# Patient Record
Sex: Male | Born: 1955 | Race: White | Hispanic: No | Marital: Married | Smoking: Never smoker
Health system: Southern US, Community
[De-identification: ages and names within clinical notes are randomized; demographics above are authoritative.]

## PROBLEM LIST (undated history)

## (undated) DIAGNOSIS — I10 Essential (primary) hypertension: Secondary | ICD-10-CM

## (undated) DIAGNOSIS — T7840XA Allergy, unspecified, initial encounter: Secondary | ICD-10-CM

## (undated) DIAGNOSIS — Z87442 Personal history of urinary calculi: Secondary | ICD-10-CM

## (undated) DIAGNOSIS — N189 Chronic kidney disease, unspecified: Secondary | ICD-10-CM

## (undated) DIAGNOSIS — E7800 Pure hypercholesterolemia, unspecified: Secondary | ICD-10-CM

## (undated) HISTORY — PX: ANKLE SURGERY: SHX546

## (undated) HISTORY — DX: Chronic kidney disease, unspecified: N18.9

## (undated) HISTORY — DX: Essential (primary) hypertension: I10

## (undated) HISTORY — PX: EYE SURGERY: SHX253

## (undated) HISTORY — DX: Allergy, unspecified, initial encounter: T78.40XA

## (undated) HISTORY — PX: COLONOSCOPY: SHX174

---

## 2010-04-07 ENCOUNTER — Encounter: Payer: Self-pay | Admitting: Internal Medicine

## 2010-08-18 NOTE — Letter (Signed)
Summary: Colonoscopy Date Change Letter  [facility]  [address]   [place], [place] [address]   Phone: [phone]  Fax: [phone]      April 07, 2010 MRN: [id number]   Joshua [name] [address] [place], [place]  [address]   Dear Mr. Carley,   Previously you were recommended to have a repeat colonoscopy around this time. Your chart was recently reviewed by Dr.Gessner of [facility]. Follow up colonoscopy is now recommended in February 2015. This revised recommendation is based on current, nationally recognized guidelines for colorectal cancer screening and polyp surveillance. These guidelines are endorsed by the American Cancer Society, [employer] on Colorectal Cancer as well as numerous other major medical organizations.  Please understand that our recommendation assumes that you do not have any new symptoms such as bleeding, a change in bowel habits, anemia, or significant abdominal discomfort. If you do have any concerning GI symptoms or want to discuss the guideline recommendations, please call to arrange an office visit at your earliest convenience. Otherwise we will keep you in our reminder system and contact you 1-2 months prior to the date listed above to schedule your next colonoscopy.  Thank you,   Stan Head, M.D.  [facility] [phone]

## 2011-07-14 ENCOUNTER — Encounter: Payer: Self-pay | Admitting: Emergency Medicine

## 2011-08-03 ENCOUNTER — Other Ambulatory Visit: Payer: Self-pay | Admitting: Emergency Medicine

## 2011-08-04 ENCOUNTER — Ambulatory Visit (INDEPENDENT_AMBULATORY_CARE_PROVIDER_SITE_OTHER): Payer: 59 | Admitting: Emergency Medicine

## 2011-08-04 ENCOUNTER — Encounter: Payer: Self-pay | Admitting: Emergency Medicine

## 2011-08-04 VITALS — BP 127/85 | HR 74 | Temp 98.5°F | Resp 16 | Ht 72.0 in | Wt 236.0 lb

## 2011-08-04 DIAGNOSIS — B078 Other viral warts: Secondary | ICD-10-CM

## 2011-08-04 DIAGNOSIS — Z0000 Encounter for general adult medical examination without abnormal findings: Secondary | ICD-10-CM

## 2011-08-04 DIAGNOSIS — I10 Essential (primary) hypertension: Secondary | ICD-10-CM

## 2011-08-04 DIAGNOSIS — E663 Overweight: Secondary | ICD-10-CM

## 2011-08-04 LAB — POCT URINALYSIS DIPSTICK
Glucose, UA: NEGATIVE
Leukocytes, UA: NEGATIVE
Nitrite, UA: NEGATIVE
Protein, UA: NEGATIVE
Urobilinogen, UA: 0.2

## 2011-08-04 LAB — COMPREHENSIVE METABOLIC PANEL
AST: 18 U/L (ref 0–37)
Albumin: 4.3 g/dL (ref 3.5–5.2)
BUN: 14 mg/dL (ref 6–23)
CO2: 23 mEq/L (ref 19–32)
Calcium: 9.2 mg/dL (ref 8.4–10.5)
Chloride: 108 mEq/L (ref 96–112)
Potassium: 4.2 mEq/L (ref 3.5–5.3)

## 2011-08-04 LAB — POCT UA - MICROSCOPIC ONLY
Epithelial cells, urine per micros: NEGATIVE
RBC, urine, microscopic: NEGATIVE
WBC, Ur, HPF, POC: NEGATIVE
Yeast, UA: NEGATIVE

## 2011-08-04 LAB — LIPID PANEL
Cholesterol: 133 mg/dL (ref 0–200)
HDL: 38 mg/dL — ABNORMAL LOW (ref >39)

## 2011-08-04 LAB — IFOBT (OCCULT BLOOD): IFOBT: NEGATIVE

## 2011-08-04 LAB — TSH: TSH: 2.035 u[IU]/mL (ref 0.350–4.500)

## 2011-08-04 MED ORDER — LISINOPRIL 40 MG PO TABS: 40.0000 mg | ORAL_TABLET | Freq: Every day | ORAL | Status: DC

## 2011-08-04 MED ORDER — AMLODIPINE BESYLATE 5 MG PO TABS: 5.0000 mg | ORAL_TABLET | Freq: Every day | ORAL | Status: DC

## 2011-08-04 NOTE — Progress Notes (Signed)
  Subjective:    Patient ID: Joshua [name], male    DOB: 01-31-1956, 56 y.o.   MRN: [id number]  HPI patient here for his annual physical exam he is scheduled to be married in approximately 5 weeks. This will be his first marriage his fianc second marriage. She has 2 children in their 83s.    Review of Systems  Constitutional: Negative.   HENT: Negative.   Eyes: Negative.   Respiratory: Negative.   Cardiovascular: Negative.   Gastrointestinal: Negative.   Genitourinary: Negative.   Musculoskeletal: Negative.   Skin:       He has a plantars wart on his right foot he would like to have treated  Neurological: Negative.   Hematological: Negative.   Psychiatric/Behavioral: Negative.        Objective:   Physical Exam  Constitutional: He appears well-developed and well-nourished.  HENT:  Head: Normocephalic.  Right Ear: External ear normal.  Left Ear: External ear normal.  Eyes: Pupils are equal, round, and reactive to light.  Neck: No tracheal deviation present. No thyromegaly present.  Cardiovascular: Normal rate and regular rhythm.  Exam reveals friction rub. Exam reveals no gallop.   No murmur heard. Pulmonary/Chest: Effort normal and breath sounds normal. No respiratory distress. He has no wheezes. He has no rales. He exhibits no tenderness.  Abdominal: Soft. Bowel sounds are normal. He exhibits no distension and no mass. There is no tenderness. There is no rebound and no guarding.  Musculoskeletal: Normal range of motion.  Neurological: He is alert.  Skin:       There is a 1 x 1 cm plantars wart over the mid metatarsals right foot   Results for orders placed in visit on 08/04/11  POCT URINALYSIS DIPSTICK      Component Value Range   Color, UA yellow     Clarity, UA hazy     Glucose, UA neg     Bilirubin, UA neg     Ketones, UA trace     Spec Grav, UA 1.025     Blood, UA neg     pH, UA 5.5     Protein, UA neg     Urobilinogen, UA 0.2     Nitrite, UA neg     Leukocytes, UA Negative    POCT UA - MICROSCOPIC ONLY      Component Value Range   WBC, Ur, HPF, POC neg     RBC, urine, microscopic neg     Bacteria, U Microscopic neg     Mucus, UA neg     Epithelial cells, urine per micros neg     Crystals, Ur, HPF, POC trace     Casts, Ur, LPF, POC neg     Yeast, UA neg    IFOBT (OCCULT BLOOD)      Component Value Range   IFOBT Negative           Assessment & Plan:  He looks great he is doing well on the current treatment program. He is to continue to work on exercise and weight loss. He was given a prescription for shingles vaccine.

## 2011-08-04 NOTE — Progress Notes (Signed)
  Subjective:    Patient ID: Joshua [name], male    DOB: 08-28-1955, 56 y.o.   MRN: [id number]  HPI    Review of Systems  Constitutional: Negative.   HENT: Positive for tinnitus.   Eyes: Negative.   Respiratory: Negative.   Cardiovascular: Negative.   Gastrointestinal: Negative.   Genitourinary: Negative.   Skin: Negative.   Neurological: Negative.   Hematological: Negative.   Psychiatric/Behavioral: Negative.        Objective:   Physical Exam        Assessment & Plan:

## 2011-08-05 LAB — PSA: PSA: 0.99 ng/mL (ref <=4.00)

## 2011-08-05 LAB — CBC
HCT: 45.3 % (ref 39.0–52.0)
Hemoglobin: 14.8 g/dL (ref 13.0–17.0)
RBC: 5.03 MIL/uL (ref 4.22–5.81)
RDW: 13.6 % (ref 11.5–15.5)
WBC: 5.9 10*3/uL (ref 4.0–10.5)

## 2011-09-24 ENCOUNTER — Other Ambulatory Visit: Payer: Self-pay | Admitting: Physician Assistant

## 2011-09-30 ENCOUNTER — Telehealth: Payer: Self-pay

## 2011-09-30 MED ORDER — ATORVASTATIN CALCIUM 40 MG PO TABS: 40.0000 mg | ORAL_TABLET | Freq: Every day | ORAL | Status: DC

## 2011-09-30 NOTE — Telephone Encounter (Signed)
Called pt and lipitor is 40 mg this is sent in for him and he is aware.

## 2011-09-30 NOTE — Telephone Encounter (Signed)
The patient called to state that the rx's from 08/04/11 were never sent to his pharmacy ([facility] in [place]-phone: [phone])  The patient is requesting 90 day supply of Amlodipine, Lisinopril and generic Lipitor.  Please call the patient at [phone].

## 2011-09-30 NOTE — Telephone Encounter (Signed)
Both meds sent and received by the pharmacy on 08/04/2011.  Since he'd just had them filled on 6/10, they were placed on hold.  I asked Melissa to go ahead and fill them for him, but as #90, instead of #30.  The Lipitor was not initially sent, but it is not on his medication list in [facility].  Please get the dose and send in #90, RF x 3.

## 2011-11-16 ENCOUNTER — Ambulatory Visit: Payer: 59

## 2011-11-16 ENCOUNTER — Ambulatory Visit: Payer: 59 | Admitting: Family Medicine

## 2011-11-16 VITALS — BP 130/86 | HR 84 | Temp 97.6°F | Resp 18 | Ht 73.0 in | Wt 236.0 lb

## 2011-11-16 DIAGNOSIS — R05 Cough: Secondary | ICD-10-CM

## 2011-11-16 DIAGNOSIS — R059 Cough, unspecified: Secondary | ICD-10-CM

## 2011-11-16 DIAGNOSIS — R350 Frequency of micturition: Secondary | ICD-10-CM

## 2011-11-16 DIAGNOSIS — J189 Pneumonia, unspecified organism: Secondary | ICD-10-CM

## 2011-11-16 LAB — POCT URINALYSIS DIPSTICK
Bilirubin, UA: NEGATIVE
Glucose, UA: NEGATIVE
Ketones, UA: NEGATIVE
Leukocytes, UA: NEGATIVE
Nitrite, UA: NEGATIVE

## 2011-11-16 LAB — POCT UA - MICROSCOPIC ONLY
Epithelial cells, urine per micros: NEGATIVE
Yeast, UA: NEGATIVE

## 2011-11-16 MED ORDER — HYDROCODONE-HOMATROPINE 5-1.5 MG/5ML PO SYRP: 5.0000 mL | ORAL_SOLUTION | Freq: Three times a day (TID) | ORAL | Status: DC | PRN

## 2011-11-16 MED ORDER — MOXIFLOXACIN HCL 400 MG PO TABS: 400.0000 mg | ORAL_TABLET | Freq: Every day | ORAL | Status: DC

## 2011-11-16 NOTE — Progress Notes (Signed)
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  He's actually had a cough since the end of July.  The cough has not been productive.  Has been on the lisinopril for a couple years.  Also developed polyuria on the flight back from [place].  Objective:  NAD, color good HEENT:  Some rhinorrhea and PND, otherwise neg Neck:  Supple, no adenopathy Chest: few ronchi, no rales Heart:  Regular without murmur, gallop or rub  Results for orders placed in visit on 11/16/11  POCT UA - MICROSCOPIC ONLY      Component Value Range   WBC, Ur, HPF, POC 0-2     RBC, urine, microscopic 0-2     Bacteria, U Microscopic neg     Mucus, UA trace     Epithelial cells, urine per micros neg     Crystals, Ur, HPF, POC neg     Casts, Ur, LPF, POC neg     Yeast, UA neg    POCT URINALYSIS DIPSTICK      Component Value Range   Color, UA yellow     Clarity, UA clear     Glucose, UA neg     Bilirubin, UA neg     Ketones, UA neg     Spec Grav, UA 1.020     Blood, UA neg     pH, UA 6.0     Protein, UA neg     Urobilinogen, UA 0.2     Nitrite, UA neg     Leukocytes, UA Negative     [facility] reading (PRIMARY) by  Dr. Milus Glazier:  CXR - infiltrate RLL  Assessment:  Persistent cough with infiltrate.  Polyuria believed to be a transient symptoms  1. Pneumonia  moxifloxacin (AVELOX) 400 MG tablet  2. Urinary frequency  POCT UA - Microscopic Only, POCT urinalysis dipstick  3. Cough  DG Chest 2 View, HYDROcodone-homatropine (HYCODAN) 5-1.5 MG/5ML syrup   .

## 2011-11-29 ENCOUNTER — Ambulatory Visit: Payer: 59

## 2011-11-29 ENCOUNTER — Ambulatory Visit (INDEPENDENT_AMBULATORY_CARE_PROVIDER_SITE_OTHER): Payer: 59 | Admitting: Emergency Medicine

## 2011-11-29 VITALS — BP 116/80 | HR 74 | Temp 98.2°F | Resp 16 | Ht 73.0 in | Wt 240.0 lb

## 2011-11-29 DIAGNOSIS — R059 Cough, unspecified: Secondary | ICD-10-CM

## 2011-11-29 DIAGNOSIS — R05 Cough: Secondary | ICD-10-CM

## 2011-11-29 DIAGNOSIS — J129 Viral pneumonia, unspecified: Secondary | ICD-10-CM

## 2011-11-29 DIAGNOSIS — I10 Essential (primary) hypertension: Secondary | ICD-10-CM

## 2011-11-29 MED ORDER — LOSARTAN POTASSIUM 100 MG PO TABS: 100.0000 mg | ORAL_TABLET | Freq: Every day | ORAL | Status: DC

## 2011-11-29 MED ORDER — BENZONATATE 100 MG PO CAPS: 100.0000 mg | ORAL_CAPSULE | Freq: Three times a day (TID) | ORAL | Status: DC | PRN

## 2011-11-29 NOTE — Progress Notes (Signed)
  Subjective:    Patient ID: Joshua [name], male    DOB: 1955-12-26, 56 y.o.   MRN: [id number]  HPI patient is with persistent cough or he was initially treated for pneumonia with Avelox and Hycodan for cough. He feels better he history of breath easily and does cough easily with exertion he has been him to sleep okay    Review of Systems     Objective:   Physical Exam neck is supple. Chest was clear to auscultation and percussion cardiac exam reveals regular rate and rhythm without murmurs rubs or gallops. Initial chest x-ray was inconclusive with questionable epicardial fat versus infiltrate.  [facility] reading (PRIMARY) by  Dr.Genavive Kubicki there is a probable epicardial fat-pad on the right and questionable left basilar infiltrate        Assessment & Plan:  We'll check chest x-ray to make sure everything has cleared there still abnormal findings in the base of both lungs. On the right looks more like an epicardial fat pad I'm not sure if the area on the left is a resolving infiltrate. Await the radiologist's report and need a repeat chest x-ray in 2-4 weeks or plan CPE. Had stopped the patient's lisinopril and he will take losartan 100 qd.

## 2012-04-28 ENCOUNTER — Ambulatory Visit: Payer: 59 | Admitting: Family Medicine

## 2012-04-28 VITALS — BP 118/90 | HR 84 | Temp 98.3°F | Resp 16 | Ht 72.5 in | Wt 242.0 lb

## 2012-04-28 DIAGNOSIS — J019 Acute sinusitis, unspecified: Secondary | ICD-10-CM

## 2012-04-28 DIAGNOSIS — J069 Acute upper respiratory infection, unspecified: Secondary | ICD-10-CM

## 2012-04-28 MED ORDER — FLUTICASONE PROPIONATE 50 MCG/ACT NA SUSP: NASAL | Status: DC

## 2012-04-28 MED ORDER — AZITHROMYCIN 250 MG PO TABS: ORAL_TABLET | ORAL | Status: DC

## 2012-04-28 NOTE — Patient Instructions (Signed)
Take azithromycin as directed  Mucinex plain to help keep secretions thin  Flonase 2 sprays each nostril twice daily for 3 days, then once daily

## 2012-04-28 NOTE — Progress Notes (Signed)
Subjective:  57 year old man with history of having upper Sartor congestion for about 4 days. He has not been febrile. He feels full in his sinuses and congestion in his nose. It may difficult for her sleep last night. He has had some postnasal drainage and sore throat. Only a minimal cough. He is a nonsmoker. He works for [employer]. He is on blood pressure and cholesterol medications.  Objective: Well-developed well-nourished man in no major distress. TMs are normal. Nose congested. Not erythematous. Some postnasal drainage. Neck supple without significant nodes. Chest is clear to auscultation. Heart regular without murmurs.  Assessment:  URI and postnasal drainage  Plan: Mucinex.  Fluids, rest.

## 2012-08-16 ENCOUNTER — Ambulatory Visit (INDEPENDENT_AMBULATORY_CARE_PROVIDER_SITE_OTHER): Payer: 59 | Admitting: Emergency Medicine

## 2012-08-16 ENCOUNTER — Encounter: Payer: Self-pay | Admitting: Emergency Medicine

## 2012-08-16 VITALS — BP 123/89 | HR 72 | Temp 98.1°F | Resp 20 | Ht 72.0 in | Wt 242.2 lb

## 2012-08-16 DIAGNOSIS — J019 Acute sinusitis, unspecified: Secondary | ICD-10-CM

## 2012-08-16 DIAGNOSIS — I10 Essential (primary) hypertension: Secondary | ICD-10-CM

## 2012-08-16 DIAGNOSIS — Z1211 Encounter for screening for malignant neoplasm of colon: Secondary | ICD-10-CM

## 2012-08-16 DIAGNOSIS — J069 Acute upper respiratory infection, unspecified: Secondary | ICD-10-CM

## 2012-08-16 DIAGNOSIS — F329 Major depressive disorder, single episode, unspecified: Secondary | ICD-10-CM

## 2012-08-16 DIAGNOSIS — N529 Male erectile dysfunction, unspecified: Secondary | ICD-10-CM

## 2012-08-16 DIAGNOSIS — Z0000 Encounter for general adult medical examination without abnormal findings: Secondary | ICD-10-CM

## 2012-08-16 DIAGNOSIS — F3289 Other specified depressive episodes: Secondary | ICD-10-CM

## 2012-08-16 DIAGNOSIS — F32A Depression, unspecified: Secondary | ICD-10-CM

## 2012-08-16 LAB — CBC WITH DIFFERENTIAL/PLATELET
Basophils Relative: 0 % (ref 0–1)
Eosinophils Absolute: 0.1 10*3/uL (ref 0.0–0.7)
Eosinophils Relative: 2 % (ref 0–5)
Hemoglobin: 13.3 g/dL (ref 13.0–17.0)
Lymphs Abs: 1.5 10*3/uL (ref 0.7–4.0)
MCH: 33.6 pg (ref 26.0–34.0)
MCHC: 36.9 g/dL — ABNORMAL HIGH (ref 30.0–36.0)
MCV: 90.9 fL (ref 78.0–100.0)
Monocytes Relative: 7 % (ref 3–12)
Neutrophils Relative %: 61 % (ref 43–77)
Platelets: 262 10*3/uL (ref 150–400)
RBC: 3.96 MIL/uL — ABNORMAL LOW (ref 4.22–5.81)

## 2012-08-16 LAB — COMPREHENSIVE METABOLIC PANEL
Albumin: 4.2 g/dL (ref 3.5–5.2)
BUN: 17 mg/dL (ref 6–23)
CO2: 25 mEq/L (ref 19–32)
Calcium: 9.1 mg/dL (ref 8.4–10.5)
Chloride: 105 mEq/L (ref 96–112)
Creat: 0.95 mg/dL (ref 0.50–1.35)
Glucose, Bld: 99 mg/dL (ref 70–99)
Potassium: 4.2 mEq/L (ref 3.5–5.3)

## 2012-08-16 LAB — LIPID PANEL
Cholesterol: 125 mg/dL (ref 0–200)
HDL: 47 mg/dL (ref >39)
Total CHOL/HDL Ratio: 2.7 Ratio
Triglycerides: 72 mg/dL (ref <150)

## 2012-08-16 LAB — POCT URINALYSIS DIPSTICK
Bilirubin, UA: NEGATIVE
Blood, UA: NEGATIVE
Glucose, UA: NEGATIVE
Spec Grav, UA: 1.02
pH, UA: 7

## 2012-08-16 MED ORDER — ATORVASTATIN CALCIUM 40 MG PO TABS: 40.0000 mg | ORAL_TABLET | Freq: Every day | ORAL | Status: DC

## 2012-08-16 MED ORDER — AMLODIPINE BESYLATE 5 MG PO TABS: 5.0000 mg | ORAL_TABLET | Freq: Every day | ORAL | Status: DC

## 2012-08-16 MED ORDER — LOSARTAN POTASSIUM 100 MG PO TABS: 100.0000 mg | ORAL_TABLET | Freq: Every day | ORAL | Status: DC

## 2012-08-16 MED ORDER — TADALAFIL 20 MG PO TABS: ORAL_TABLET | ORAL | Status: DC

## 2012-08-16 NOTE — Progress Notes (Signed)
@[facility] @  Patient ID: Joshua [name] MRN: [id number], DOB: February 24, 1955 57 y.o. Date of Encounter: 08/16/2012, 1:18 PM  Primary Physician: Default, Provider, MD  Chief Complaint: Physical (CPE)  HPI: 57 y.o. y/o male with history noted below here for CPE.  Doing well. No issues/complaints.  Review of Systems:  Consitutional: No fever, chills, fatigue, night sweats, lymphadenopathy, or weight changes. Eyes: No visual changes, eye redness, or discharge. ENT/Mouth: Ears: No otalgia, hearing loss, discharge. Nose: No congestion, rhinorrhea, sinus pain, or epistaxis. Throat: No sore throat, post nasal drip, or teeth pain. Pt c/o  tinnitus, Cardiovascular: No CP, palpitations, diaphoresis, DOE, edema, orthopnea, PND. Respiratory: No cough, hemoptysis, SOB, or wheezing. Gastrointestinal: No anorexia, dysphagia, reflux, pain, nausea, vomiting, hematemesis, diarrhea, constipation, BRBPR, or melena. Genitourinary: No dysuria, urgency, hematuria, incontinence, nocturia, decreased urinary stream, discharge,  , or testicular pain/masses. He has had recent problems with EEP inability to sustain an erection. Pt has urinary frequency. Musculoskeletal: No decreased ROM, stiffness, joint swelling, or weakness. Joint pain Skin: No rash, erythema, lesion changes, pain, warmth, jaundice, or pruritis. Neurological: No headache, dizziness, syncope, seizures, tremors, memory loss, coordination problems, or paresthesias. Psychological: He has been somewhat depressed recently. His mother has been angry with him since he got married. She has had frequent admissions to the hospital with anxiety depression and is bothersome to him   Endocrine: No fatigue, polydipsia, polyphagia, polyuria, or known diabetes. All other systems were reviewed and are otherwise negative.  Past Medical History  Diagnosis Date  . Allergy      Past Surgical History  Procedure Laterality Date  . Eye surgery    . Right ankle     no other information given    Home Meds:  Prior to Admission medications   Medication Sig Start Date End Date Taking? Authorizing Provider  amLODipine (NORVASC) 5 MG tablet Take 1 tablet (5 mg total) by mouth daily. 08/04/11  Yes Collene Gobble, MD  aspirin 81 MG tablet Take 81 mg by mouth daily.   Yes Historical Provider, MD  atorvastatin (LIPITOR) 40 MG tablet Take 1 tablet (40 mg total) by mouth daily. 09/30/11 09/29/12 Yes Chelle S Jeffery, PA-C  losartan (COZAAR) 100 MG tablet Take 1 tablet (100 mg total) by mouth daily. 11/29/11  Yes Collene Gobble, MD  azithromycin (ZITHROMAX) 250 MG tablet Take 2 today, then one daily for infection 04/28/12   Peyton Najjar, MD  benzonatate (TESSALON) 100 MG capsule Take 1-2 capsules (100-200 mg total) by mouth 3 (three) times daily as needed for cough. 11/29/11   Collene Gobble, MD  fluticasone (FLONASE) 50 MCG/ACT nasal spray 2 sprays each nostril twice daily for 3 days, then decrease to once daily 04/28/12   Peyton Najjar, MD  HYDROcodone-homatropine [facility]) 5-1.5 MG/5ML syrup Take 5 mLs by mouth every 8 (eight) hours as needed for cough. 11/16/11   Elvina Sidle, MD  moxifloxacin (AVELOX) 400 MG tablet Take 1 tablet (400 mg total) by mouth daily. 11/16/11   Elvina Sidle, MD    Allergies: No Known Allergies  History   Social History  . Marital Status: Single    Spouse Name: N/A    Number of Children: N/A  . Years of Education: N/A   Occupational History  . Not on file.   Social History Main Topics  . Smoking status: Never Smoker   . Smokeless tobacco: Not on file  . Alcohol Use: No  . Drug Use: No  . Sexually Active:  Yes   Other Topics Concern  . Not on file   Social History Narrative  . No narrative on file    Family History  Problem Relation Age of Onset  . Hypertension Mother   . Cancer Father     Physical Exam:  Blood pressure 123/89, pulse 72, temperature 98.1 F (36.7 C), temperature source Oral, resp. rate 20, height 6'  (1.829 m), weight 242 lb 3.2 oz (109.861 kg), SpO2 96.00%.  General: Well developed, well nourished, in no acute distress. HEENT: Normocephalic, atraumatic. Conjunctiva pink, sclera non-icteric. Pupils 2 mm constricting to 1 mm, round, regular, and equally reactive to light and accomodation. EOMI. Internal auditory canal clear. TMs with good cone of light and without pathology. Nasal mucosa pink. Nares are without discharge. No sinus tenderness. Oral mucosa pink. Dentition . Pharynx without exudate.   Neck: Supple. Trachea midline. No thyromegaly. Full ROM. No lymphadenopathy. Lungs: Clear to auscultation bilaterally without wheezes, rales, or rhonchi. Breathing is of normal effort and unlabored. Cardiovascular: RRR with S1 S2. No murmurs, rubs, or gallops appreciated. Distal pulses 2+ symmetrically. No carotid or abdominal bruits.  Abdomen: Soft, non-tender, non-distended with normoactive bowel sounds. No hepatosplenomegaly or masses. No rebound/guarding. No CVA tenderness. Without hernias.  Rectal: No external hemorrhoids or fissures. Rectal vault without masses.   Genitourinary:   circumcised male. No penile lesions. Testes descended bilaterally, and smooth without tenderness or masses.  Musculoskeletal: Full range of motion and 5/5 strength throughout. Without swelling, atrophy, tenderness, crepitus, or warmth. Extremities without clubbing, cyanosis, or edema. Calves supple. Skin: Warm and moist without erythema, ecchymosis, wounds, or rash. Neuro: A+Ox3. CN II-XII grossly intact. Moves all extremities spontaneously. Full sensation throughout. Normal gait. DTR 2+ throughout upper and lower extremities. Finger to nose intact. Psych:  Responds to questions appropriately with a normal affect.   Studies: CBC, CMET, Lipid, PSA UA EKG     Assessment/Plan:  57 y.o. y/o   male here for CPE. Routine labs were ordered. He is going to give a try with Cialis and see how that works. I gave him the  option of getting some counseling to help with the situation with his mother and new marriage if that would be helpful. -  Signed, Earl Lites, MD 08/16/2012 1:18 PM

## 2012-08-17 LAB — TSH: TSH: 1.842 u[IU]/mL (ref 0.350–4.500)

## 2013-04-26 ENCOUNTER — Encounter: Payer: Self-pay | Admitting: Internal Medicine

## 2013-09-05 ENCOUNTER — Encounter: Payer: 59 | Admitting: Emergency Medicine

## 2013-09-07 ENCOUNTER — Encounter: Payer: Self-pay | Admitting: Internal Medicine

## 2013-10-17 ENCOUNTER — Other Ambulatory Visit: Payer: Self-pay | Admitting: Emergency Medicine

## 2013-11-02 ENCOUNTER — Ambulatory Visit (INDEPENDENT_AMBULATORY_CARE_PROVIDER_SITE_OTHER): Payer: 59

## 2013-11-02 ENCOUNTER — Ambulatory Visit (INDEPENDENT_AMBULATORY_CARE_PROVIDER_SITE_OTHER): Payer: 59 | Admitting: Emergency Medicine

## 2013-11-02 ENCOUNTER — Encounter: Payer: Self-pay | Admitting: Emergency Medicine

## 2013-11-02 VITALS — BP 120/82 | HR 73 | Temp 98.3°F | Resp 16 | Ht 72.0 in | Wt 245.8 lb

## 2013-11-02 DIAGNOSIS — Z0000 Encounter for general adult medical examination without abnormal findings: Secondary | ICD-10-CM

## 2013-11-02 DIAGNOSIS — R059 Cough, unspecified: Secondary | ICD-10-CM

## 2013-11-02 DIAGNOSIS — I10 Essential (primary) hypertension: Secondary | ICD-10-CM

## 2013-11-02 DIAGNOSIS — Z13220 Encounter for screening for lipoid disorders: Secondary | ICD-10-CM

## 2013-11-02 DIAGNOSIS — Z569 Unspecified problems related to employment: Secondary | ICD-10-CM

## 2013-11-02 DIAGNOSIS — R05 Cough: Secondary | ICD-10-CM

## 2013-11-02 DIAGNOSIS — Z566 Other physical and mental strain related to work: Secondary | ICD-10-CM

## 2013-11-02 DIAGNOSIS — E785 Hyperlipidemia, unspecified: Secondary | ICD-10-CM | POA: Insufficient documentation

## 2013-11-02 DIAGNOSIS — J3489 Other specified disorders of nose and nasal sinuses: Secondary | ICD-10-CM

## 2013-11-02 DIAGNOSIS — N529 Male erectile dysfunction, unspecified: Secondary | ICD-10-CM

## 2013-11-02 DIAGNOSIS — N521 Erectile dysfunction due to diseases classified elsewhere: Secondary | ICD-10-CM

## 2013-11-02 LAB — POCT URINALYSIS DIPSTICK
BILIRUBIN UA: NEGATIVE
Blood, UA: NEGATIVE
GLUCOSE UA: NEGATIVE
KETONES UA: NEGATIVE
LEUKOCYTES UA: NEGATIVE
Nitrite, UA: NEGATIVE
Protein, UA: NEGATIVE
SPEC GRAV UA: 1.01
Urobilinogen, UA: 0.2
pH, UA: 6.5

## 2013-11-02 LAB — CBC WITH DIFFERENTIAL/PLATELET
BASOS PCT: 0 % (ref 0–1)
Basophils Absolute: 0 10*3/uL (ref 0.0–0.1)
Eosinophils Absolute: 0.1 10*3/uL (ref 0.0–0.7)
Eosinophils Relative: 2 % (ref 0–5)
HEMATOCRIT: 40.2 % (ref 39.0–52.0)
HEMOGLOBIN: 14 g/dL (ref 13.0–17.0)
Lymphocytes Relative: 35 % (ref 12–46)
Lymphs Abs: 2.1 10*3/uL (ref 0.7–4.0)
MCH: 29.5 pg (ref 26.0–34.0)
MCHC: 34.8 g/dL (ref 30.0–36.0)
MCV: 84.6 fL (ref 78.0–100.0)
MONO ABS: 0.5 10*3/uL (ref 0.1–1.0)
MONOS PCT: 9 % (ref 3–12)
NEUTROS ABS: 3.3 10*3/uL (ref 1.7–7.7)
Neutrophils Relative %: 54 % (ref 43–77)
Platelets: 251 10*3/uL (ref 150–400)
RBC: 4.75 MIL/uL (ref 4.22–5.81)
RDW: 13.2 % (ref 11.5–15.5)
WBC: 6.1 10*3/uL (ref 4.0–10.5)

## 2013-11-02 LAB — COMPLETE METABOLIC PANEL WITH GFR
ALT: 34 U/L (ref 0–53)
AST: 24 U/L (ref 0–37)
Albumin: 4.3 g/dL (ref 3.5–5.2)
Alkaline Phosphatase: 69 U/L (ref 39–117)
BILIRUBIN TOTAL: 1.1 mg/dL (ref 0.2–1.2)
BUN: 12 mg/dL (ref 6–23)
CALCIUM: 9.3 mg/dL (ref 8.4–10.5)
CHLORIDE: 104 meq/L (ref 96–112)
CO2: 26 meq/L (ref 19–32)
CREATININE: 1.02 mg/dL (ref 0.50–1.35)
GFR, Est Non African American: 80 mL/min
Glucose, Bld: 99 mg/dL (ref 70–99)
Potassium: 4.1 mEq/L (ref 3.5–5.3)
Sodium: 136 mEq/L (ref 135–145)
Total Protein: 6.5 g/dL (ref 6.0–8.3)

## 2013-11-02 LAB — LIPID PANEL
CHOLESTEROL: 117 mg/dL (ref 0–200)
HDL: 48 mg/dL (ref >39)
LDL Cholesterol: 56 mg/dL (ref 0–99)
Total CHOL/HDL Ratio: 2.4 Ratio
Triglycerides: 67 mg/dL (ref <150)
VLDL: 13 mg/dL (ref 0–40)

## 2013-11-02 LAB — IFOBT (OCCULT BLOOD): IMMUNOLOGICAL FECAL OCCULT BLOOD TEST: NEGATIVE

## 2013-11-02 LAB — TSH: TSH: 2.366 u[IU]/mL (ref 0.350–4.500)

## 2013-11-02 MED ORDER — LOSARTAN POTASSIUM 100 MG PO TABS: 100.0000 mg | ORAL_TABLET | Freq: Every day | ORAL | Status: DC

## 2013-11-02 MED ORDER — OMEPRAZOLE 40 MG PO CPDR: 40.0000 mg | DELAYED_RELEASE_CAPSULE | Freq: Every day | ORAL | Status: DC

## 2013-11-02 MED ORDER — BENZONATATE 100 MG PO CAPS: 100.0000 mg | ORAL_CAPSULE | Freq: Three times a day (TID) | ORAL | Status: DC | PRN

## 2013-11-02 MED ORDER — TADALAFIL 20 MG PO TABS: ORAL_TABLET | ORAL | Status: DC

## 2013-11-02 NOTE — Progress Notes (Signed)
Subjective:    Patient ID: Joshua [name], male    DOB: 1955/03/17, 58 y.o.   MRN: [id number]  This chart was scribed for Darlyne Russian, MD by Edison Simon, ED Scribe. This patient was seen in room 22 and the patient's care was started at 8:40 AM.    HPI HPI Comments: Joshua [name] is a 58 y.o. male who presents to the Emergency Department complaining of dry, nonproductive cough. He reports using a Z-pack without remission of symptoms. He also reports urinary frequency, a few times a night. He reports intermittent joint pain but not enough that he feels the need to treat with medication.  Review of Systems  Respiratory: Positive for cough (dry non-productive).   Genitourinary: Positive for frequency.  Musculoskeletal:       Joint pain  Allergic/Immunologic: Positive for environmental allergies.       Objective:   Physical Exam  Nursing note and vitals reviewed. Constitutional: He is oriented to person, place, and time. He appears well-developed and well-nourished. No distress.  HENT:  Head: Normocephalic and atraumatic.  Eyes: Conjunctivae and EOM are normal.  Neck: Normal range of motion. Neck supple.  Cardiovascular: Normal rate, regular rhythm and normal heart sounds.   No murmur heard. Pulmonary/Chest: Effort normal and breath sounds normal. No respiratory distress. He has no wheezes. He has no rales.  Musculoskeletal: Normal range of motion.  Neurological: He is alert and oriented to person, place, and time.  Skin: Skin is warm and dry.  25mm raised mole left side of nose  Psychiatric: He has a normal mood and affect.   Meds ordered this encounter  Medications  . losartan (COZAAR) 100 MG tablet    Sig: Take 1 tablet (100 mg total) by mouth daily.    Dispense:  90 tablet    Refill:  3  . tadalafil (CIALIS) 20 MG tablet    Sig: Take one half to one tablet every 2-3 days as needed for ED    Dispense:  5 tablet    Refill:  11  . omeprazole (PRILOSEC) 40 MG  capsule    Sig: Take 1 capsule (40 mg total) by mouth daily.    Dispense:  30 capsule    Refill:  3  . benzonatate (TESSALON) 100 MG capsule    Sig: Take 1-2 capsules (100-200 mg total) by mouth 3 (three) times daily as needed for cough.    Dispense:  40 capsule    Refill:  0  [facility] reading (PRIMARY) by  Dr. Everlene Farrier no acute disease Results for orders placed in visit on 11/02/13  POCT URINALYSIS DIPSTICK      Result Value Ref Range   Color, UA yellow     Clarity, UA clear     Glucose, UA neg     Bilirubin, UA neg     Ketones, UA neg     Spec Grav, UA 1.010     Blood, UA neg     pH, UA 6.5     Protein, UA neg     Urobilinogen, UA 0.2     Nitrite, UA neg     Leukocytes, UA Negative    IFOBT (OCCULT BLOOD)      Result Value Ref Range   IFOBT Negative        Assessment & Plan:  I personally performed the services described in this documentation, which was scribed in my presence. The recorded information has been reviewed and is accurate. I encouraged  him to work on his way to exercise. We'll get a cardiology referral just do to his multiple risk factors. Dermatology referral made for lesion on his nose. He will get his flu shot at work. He will make his own appointment for colonoscopy because he is due. He is up-to-date on tetanus vaccination.

## 2013-11-02 NOTE — Patient Instructions (Signed)
Stress Stress-related medical problems are becoming increasingly common. The body has a built-in physical response to stressful situations. Faced with pressure, challenge or danger, we need to react quickly. Our bodies release hormones such as cortisol and adrenaline to help do this. These hormones are part of the "fight or flight" response and affect the metabolic rate, heart rate and blood pressure, resulting in a heightened, stressed state that prepares the body for optimum performance in dealing with a stressful situation. It is likely that early man required these mechanisms to stay alive, but usually modern stresses do not call for this, and the same hormones released in today's world can damage health and reduce coping ability. CAUSES  Pressure to perform at work, at school or in sports.  Threats of physical violence.  Money worries.  Arguments.  Family conflicts.  Divorce or separation from significant other.  Bereavement.  New job or unemployment.  Changes in location.  Alcohol or drug abuse. SOMETIMES, THERE IS NO PARTICULAR REASON FOR DEVELOPING STRESS. Almost all people are at risk of being stressed at some time in their lives. It is important to know that some stress is temporary and some is long term.  Temporary stress will go away when a situation is resolved. Most people can cope with short periods of stress, and it can often be relieved by relaxing, taking a walk or getting any type of exercise, chatting through issues with friends, or having a good night's sleep.  Chronic (long-term, continuous) stress is much harder to deal with. It can be psychologically and emotionally damaging. It can be harmful both for an individual and for friends and family. SYMPTOMS Everyone reacts to stress differently. There are some common effects that help us recognize it. In times of extreme stress, people may:  Shake uncontrollably.  Breathe faster and deeper than normal  (hyperventilate).  Vomit.  For people with asthma, stress can trigger an attack.  For some people, stress may trigger migraine headaches, ulcers, and body pain. PHYSICAL EFFECTS OF STRESS MAY INCLUDE:  Loss of energy.  Skin problems.  Aches and pains resulting from tense muscles, including neck ache, backache and tension headaches.  Increased pain from arthritis and other conditions.  Irregular heart beat (palpitations).  Periods of irritability or anger.  Apathy or depression.  Anxiety (feeling uptight or worrying).  Unusual behavior.  Loss of appetite.  Comfort eating.  Lack of concentration.  Loss of, or decreased, sex-drive.  Increased smoking, drinking, or recreational drug use.  For women, missed periods.  Ulcers, joint pain, and muscle pain. Post-traumatic stress is the stress caused by any serious accident, strong emotional damage, or extremely difficult or violent experience such as rape or war. Post-traumatic stress victims can experience mixtures of emotions such as fear, shame, depression, guilt or anger. It may include recurrent memories or images that may be haunting. These feelings can last for weeks, months or even years after the traumatic event that triggered them. Specialized treatment, possibly with medicines and psychological therapies, is available. If stress is causing physical symptoms, severe distress or making it difficult for you to function as normal, it is worth seeing your caregiver. It is important to remember that although stress is a usual part of life, extreme or prolonged stress can lead to other illnesses that will need treatment. It is better to visit a doctor sooner rather than later. Stress has been linked to the development of high blood pressure and heart disease, as well as insomnia and depression.   There is no diagnostic test for stress since everyone reacts to it differently. But a caregiver will be able to spot the physical  symptoms, such as:  Headaches.  Shingles.  Ulcers. Emotional distress such as intense worry, low mood or irritability should be detected when the doctor asks pertinent questions to identify any underlying problems that might be the cause. In case there are physical reasons for the symptoms, the doctor may also want to do some tests to exclude certain conditions. If you feel that you are suffering from stress, try to identify the aspects of your life that are causing it. Sometimes you may not be able to change or avoid them, but even a small change can have a positive ripple effect. A simple lifestyle change can make all the difference. STRATEGIES THAT CAN HELP DEAL WITH STRESS:  Delegating or sharing responsibilities.  Avoiding confrontations.  Learning to be more assertive.  Regular exercise.  Avoid using alcohol or street drugs to cope.  Eating a healthy, balanced diet, rich in fruit and vegetables and proteins.  Finding humor or absurdity in stressful situations.  Never taking on more than you know you can handle comfortably.  Organizing your time better to get as much done as possible.  Talking to friends or family and sharing your thoughts and fears.  Listening to music or relaxation tapes.  Relaxation techniques like deep breathing, meditation, and yoga.  Tensing and then relaxing your muscles, starting at the toes and working up to the head and neck. If you think that you would benefit from help, either in identifying the things that are causing your stress or in learning techniques to help you relax, see a caregiver who is capable of helping you with this. Rather than relying on medications, it is usually better to try and identify the things in your life that are causing stress and try to deal with them. There are many techniques of managing stress including counseling, psychotherapy, aromatherapy, yoga, and exercise. Your caregiver can help you determine what is best  for you. Document Released: 05/02/2002 Document Revised: 02/14/2013 Document Reviewed: 03/29/2007 ExitCare Patient Information 2015 [employer]. This information is not intended to replace advice given to you by your health care provider. Make sure you discuss any questions you have with your health care provider. Insomnia Insomnia is frequent trouble falling and/or staying asleep. Insomnia can be a long term problem or a short term problem. Both are common. Insomnia can be a short term problem when the wakefulness is related to a certain stress or worry. Long term insomnia is often related to ongoing stress during waking hours and/or poor sleeping habits. Overtime, sleep deprivation itself can make the problem worse. Every little thing feels more severe because you are overtired and your ability to cope is decreased. CAUSES   Stress, anxiety, and depression.  Poor sleeping habits.  Distractions such as TV in the bedroom.  Naps close to bedtime.  Engaging in emotionally charged conversations before bed.  Technical reading before sleep.  Alcohol and other sedatives. They may make the problem worse. They can hurt normal sleep patterns and normal dream activity.  Stimulants such as caffeine for several hours prior to bedtime.  Pain syndromes and shortness of breath can cause insomnia.  Exercise late at night.  Changing time zones may cause sleeping problems (jet lag). It is sometimes helpful to have someone observe your sleeping patterns. They should look for periods of not breathing during the night (sleep apnea). They   should also look to see how long those periods last. If you live alone or observers are uncertain, you can also be observed at a sleep clinic where your sleep patterns will be professionally monitored. Sleep apnea requires a checkup and treatment. Give your caregivers your medical history. Give your caregivers observations your family has made about your sleep.  SYMPTOMS    Not feeling rested in the morning.  Anxiety and restlessness at bedtime.  Difficulty falling and staying asleep. TREATMENT   Your caregiver may prescribe treatment for an underlying medical disorders. Your caregiver can give advice or help if you are using alcohol or other drugs for self-medication. Treatment of underlying problems will usually eliminate insomnia problems.  Medications can be prescribed for short time use. They are generally not recommended for lengthy use.  Over-the-counter sleep medicines are not recommended for lengthy use. They can be habit forming.  You can promote easier sleeping by making lifestyle changes such as:  Using relaxation techniques that help with breathing and reduce muscle tension.  Exercising earlier in the day.  Changing your diet and the time of your last meal. No night time snacks.  Establish a regular time to go to bed.  Counseling can help with stressful problems and worry.  Soothing music and white noise may be helpful if there are background noises you cannot remove.  Stop tedious detailed work at least one hour before bedtime. HOME CARE INSTRUCTIONS   Keep a diary. Inform your caregiver about your progress. This includes any medication side effects. See your caregiver regularly. Take note of:  Times when you are asleep.  Times when you are awake during the night.  The quality of your sleep.  How you feel the next day. This information will help your caregiver care for you.  Get out of bed if you are still awake after 15 minutes. Read or do some quiet activity. Keep the lights down. Wait until you feel sleepy and go back to bed.  Keep regular sleeping and waking hours. Avoid naps.  Exercise regularly.  Avoid distractions at bedtime. Distractions include watching television or engaging in any intense or detailed activity like attempting to balance the household checkbook.  Develop a bedtime ritual. Keep a familiar  routine of bathing, brushing your teeth, climbing into bed at the same time each night, listening to soothing music. Routines increase the success of falling to sleep faster.  Use relaxation techniques. This can be using breathing and muscle tension release routines. It can also include visualizing peaceful scenes. You can also help control troubling or intruding thoughts by keeping your mind occupied with boring or repetitive thoughts like the old concept of counting sheep. You can make it more creative like imagining planting one beautiful flower after another in your backyard garden.  During your day, work to eliminate stress. When this is not possible use some of the previous suggestions to help reduce the anxiety that accompanies stressful situations. MAKE SURE YOU:   Understand these instructions.  Will watch your condition.  Will get help right away if you are not doing well or get worse. Document Released: 02/07/2000 Document Revised: 05/04/2011 Document Reviewed: 03/09/2007 ExitCare Patient Information 2015 [employer]. This information is not intended to replace advice given to you by your health care provider. Make sure you discuss any questions you have with your health care provider.  

## 2013-11-02 NOTE — Progress Notes (Deleted)
   Subjective:    Patient ID: Joshua [name], male    DOB: 1956/01/02, 58 y.o.   MRN: [id number]  HPI    Review of Systems  Constitutional: Negative.   HENT: Negative.   Eyes: Negative.   Respiratory: Positive for cough.   Cardiovascular: Negative.   Gastrointestinal: Negative.   Endocrine: Negative.   Genitourinary: Positive for frequency.  Musculoskeletal: Positive for arthralgias.  Skin: Negative.   Allergic/Immunologic: Positive for environmental allergies.  Neurological: Negative.   Hematological: Negative.   Psychiatric/Behavioral: Negative.        Objective:   Physical Exam        Assessment & Plan:

## 2013-11-03 LAB — PSA: PSA: 1 ng/mL (ref <=4.00)

## 2013-11-19 ENCOUNTER — Other Ambulatory Visit: Payer: Self-pay | Admitting: Emergency Medicine

## 2013-11-30 ENCOUNTER — Encounter: Payer: Self-pay | Admitting: Cardiology

## 2013-11-30 ENCOUNTER — Ambulatory Visit (INDEPENDENT_AMBULATORY_CARE_PROVIDER_SITE_OTHER): Payer: 59 | Admitting: Cardiology

## 2013-11-30 VITALS — BP 138/88 | HR 73 | Ht 72.0 in | Wt 241.0 lb

## 2013-11-30 DIAGNOSIS — R0609 Other forms of dyspnea: Secondary | ICD-10-CM | POA: Insufficient documentation

## 2013-11-30 DIAGNOSIS — I10 Essential (primary) hypertension: Secondary | ICD-10-CM

## 2013-11-30 DIAGNOSIS — E785 Hyperlipidemia, unspecified: Secondary | ICD-10-CM

## 2013-11-30 DIAGNOSIS — R0600 Dyspnea, unspecified: Secondary | ICD-10-CM | POA: Insufficient documentation

## 2013-11-30 NOTE — Progress Notes (Signed)
Patient ID: [name], male   DOB: [birth year], 58 y.o.   MRN: [id number]    Patient Name: Joshua [name] Date of Encounter: 11/30/2013  Primary Care Provider:  Default, Provider, MD Primary Cardiologist:  Joshua [name]  Problem List   Past Medical History  Diagnosis Date  . Allergy   . Hypertension    Past Surgical History  Procedure Laterality Date  . Eye surgery    . Right ankle      no other information given   Allergies  No Known Allergies  HPI  A very pleasant 58 year old male who is going to retire the next year and is coming for worsening dyspnea on exertion. He has noticed it over the last year especially when walking his dog and walking uphill. He has no other associated symptoms with it like CP, dizziness, nausea He has h/o obesity, hypertension and hyperlipidemia. His BP is controlled most of the time. He has been on lipitor for the last 10 years and is experiencing some aches but its tolerable. He has never smoked and his father underwent CABG at age 49. He is not engaged in any sports or regular physical activity. No orthopnea, PND, palpitations, LE edema, syncope.   Home Medications  Prior to Admission medications   Medication Sig Start Date End Date Taking? Authorizing Provider  amLODipine (NORVASC) 5 MG tablet Take 1 tablet (5 mg total) by mouth daily. 11/20/13  Yes Darlyne Russian, MD  atorvastatin (LIPITOR) 40 MG tablet Take 1 tablet (40 mg total) by mouth daily. 08/16/12  Yes Darlyne Russian, MD  losartan (COZAAR) 100 MG tablet Take 1 tablet (100 mg total) by mouth daily. 11/02/13  Yes Darlyne Russian, MD    Family History  Family History  Problem Relation Age of Onset  . Hypertension Mother   . Hyperlipidemia Mother   . Cancer Father   . Heart disease Father     Social History  History   Social History  . Marital Status: Single    Spouse Name: N/A    Number of Children: N/A  . Years of Education: N/A   Occupational History  .  [employer]    Social History Main Topics  . Smoking status: Never Smoker   . Smokeless tobacco: Not on file  . Alcohol Use: No  . Drug Use: No  . Sexual Activity: Yes   Other Topics Concern  . Not on file   Social History Narrative   Married. Education: [employer].      Review of Systems, as per HPI, otherwise negative General:  No chills, fever, night sweats or weight changes.  Cardiovascular:  No chest pain, dyspnea on exertion, edema, orthopnea, palpitations, paroxysmal nocturnal dyspnea. Dermatological: No rash, lesions/masses Respiratory: No cough, dyspnea Urologic: No hematuria, dysuria Abdominal:   No nausea, vomiting, diarrhea, bright red blood per rectum, melena, or hematemesis Neurologic:  No visual changes, wkns, changes in mental status. All other systems reviewed and are otherwise negative except as noted above.  Physical Exam  Blood pressure 138/88, pulse 73, height 6' (1.829 m), weight 241 lb (109.317 kg), SpO2 97.00%.  General: Pleasant, NAD Psych: Normal affect. Neuro: Alert and oriented X 3. Moves all extremities spontaneously. HEENT: Normal  Neck: Supple without bruits or JVD. Lungs:  Resp regular and unlabored, CTA. Heart: RRR no s3, s4, or murmurs. Abdomen: Soft, non-tender, non-distended, BS + x 4.  Extremities: No clubbing, cyanosis or edema. DP/PT/Radials 2+ and  equal bilaterally.  Labs:  No results found for this basename: CKTOTAL, CKMB, TROPONINI,  in the last 72 hours Lab Results  Component Value Date   WBC 6.1 11/02/2013   HGB 14.0 11/02/2013   HCT 40.2 11/02/2013   MCV 84.6 11/02/2013   PLT 251 11/02/2013    No results found for this basename: DDIMER   No components found with this basename: POCBNP,     Component Value Date/Time   NA 136 11/02/2013 0836   K 4.1 11/02/2013 0836   CL 104 11/02/2013 0836   CO2 26 11/02/2013 0836   GLUCOSE 99 11/02/2013 0836   BUN 12 11/02/2013 0836   CREATININE 1.02 11/02/2013 0836   CALCIUM 9.3  11/02/2013 0836   PROT 6.5 11/02/2013 0836   ALBUMIN 4.3 11/02/2013 0836   AST 24 11/02/2013 0836   ALT 34 11/02/2013 0836   ALKPHOS 69 11/02/2013 0836   BILITOT 1.1 11/02/2013 0836   GFRNONAA 80 11/02/2013 0836   GFRAA >89 11/02/2013 0836   Lab Results  Component Value Date   CHOL 117 11/02/2013   HDL 48 11/02/2013   LDLCALC 56 11/02/2013   TRIG 67 11/02/2013   Accessory Clinical Findings  Echocardiogram - none  ECG - SR, normal ECG    Assessment & Plan  A very pleasant 58 year old male  1. DOE - progressively worsening , risk factors for CAD include obesity, physical inactivity, HTN and HLP. We will order an exercise tolerance stress test to evaluate for functional capacity and ischemia. Order echo to evaluate for systolic and diastolic function.   2. HTN - we will order echo to evaluate for diastolic function, filling pressures, LVH. Also stress test will evaluate BP response to exertion.  3. Hyperlipidemia - followed by PCP  4. Physical activity - encouraged to exercise on regular basis  Follow up in 1 month.  Joshua Spark, MD, [facility] 11/30/2013, 8:26 AM

## 2013-11-30 NOTE — Patient Instructions (Signed)
Your physician recommends that you continue on your current medications as directed. Please refer to the Current Medication list given to you today.   Your physician has requested that you have an exercise tolerance test. For further information please visit [email or web address]. Please also follow instruction sheet, as given.   Your physician has requested that you have an echocardiogram. Echocardiography is a painless test that uses sound waves to create images of your heart. It provides your doctor with information about the size and shape of your heart and how well your heart's chambers and valves are working. This procedure takes approximately one hour. There are no restrictions for this procedure.   Your physician recommends that you schedule a follow-up appointment in: AFTER YOUR TESTS ARE COMPLETE

## 2013-12-26 ENCOUNTER — Telehealth (HOSPITAL_COMMUNITY): Payer: Self-pay

## 2013-12-26 NOTE — Telephone Encounter (Signed)
Encounter complete. 

## 2013-12-27 ENCOUNTER — Telehealth (HOSPITAL_COMMUNITY): Payer: Self-pay

## 2013-12-27 NOTE — Telephone Encounter (Signed)
Encounter complete. 

## 2013-12-28 ENCOUNTER — Other Ambulatory Visit: Payer: Self-pay | Admitting: Emergency Medicine

## 2013-12-28 ENCOUNTER — Ambulatory Visit (HOSPITAL_BASED_OUTPATIENT_CLINIC_OR_DEPARTMENT_OTHER)
Admission: RE | Admit: 2013-12-28 | Discharge: 2013-12-28 | Disposition: A | Discharge status: Home / Self Care | Payer: 59 | Source: Ambulatory Visit | Attending: Cardiology | Admitting: Cardiology

## 2013-12-28 ENCOUNTER — Ambulatory Visit (HOSPITAL_COMMUNITY)
Admission: RE | Admit: 2013-12-28 | Discharge: 2013-12-28 | Disposition: A | Discharge status: Home / Self Care | Payer: 59 | Source: Ambulatory Visit | Attending: Cardiology | Admitting: Cardiology

## 2013-12-28 DIAGNOSIS — R0600 Dyspnea, unspecified: Secondary | ICD-10-CM

## 2013-12-28 DIAGNOSIS — I10 Essential (primary) hypertension: Secondary | ICD-10-CM

## 2013-12-28 DIAGNOSIS — R0609 Other forms of dyspnea: Secondary | ICD-10-CM

## 2013-12-28 DIAGNOSIS — I369 Nonrheumatic tricuspid valve disorder, unspecified: Secondary | ICD-10-CM

## 2013-12-28 NOTE — Progress Notes (Signed)
2D Echo Performed 12/28/2013    Fernando Torry, RCS  

## 2013-12-28 NOTE — Procedures (Signed)
Exercise Treadmill Test   Test  Exercise Tolerance Test Ordering MD: Ena Dawley, MD  Interpreting MD:   Unique Test No: 1  Treadmill:  1  Indication for ETT: exertional dyspnea  Contraindication to ETT: No   Stress Modality: exercise - treadmill  Cardiac Imaging Performed: non   Protocol: standard Bruce - maximal  Max BP:  182/80  Max MPHR (bpm):  161 85% MPR (bpm):  137  MPHR obtained (bpm):  171 % MPHR obtained:  106  Reached 85% MPHR (min:sec):  6:00 Total Exercise Time (min-sec):  9:24  Workload in METS:  10.70 Borg Scale:   Reason ETT Terminated:  dyspnea    ST Segment Analysis At Rest: NSR with no ST changes. With Exercise: no evidence of significant ST depression  Other Information Arrhythmia:  Occasional PVC. Angina during ETT:  absent (0) Quality of ETT:  diagnostic  ETT Interpretation:  normal - no evidence of ischemia by ST analysis  Comments: ETT with good exercise tolerance (9:24); normal BP response; no chest pain; no ST changes; negative adequate ETT.  Kirk Ruths

## 2013-12-29 ENCOUNTER — Encounter: Payer: Self-pay | Admitting: *Deleted

## 2014-01-01 ENCOUNTER — Ambulatory Visit: Payer: 59 | Admitting: Cardiology

## 2014-01-09 ENCOUNTER — Encounter: Payer: Self-pay | Admitting: Cardiology

## 2014-03-25 ENCOUNTER — Other Ambulatory Visit: Payer: Self-pay | Admitting: Physician Assistant

## 2014-05-03 ENCOUNTER — Ambulatory Visit (INDEPENDENT_AMBULATORY_CARE_PROVIDER_SITE_OTHER): Payer: 59 | Admitting: Emergency Medicine

## 2014-05-03 ENCOUNTER — Encounter: Payer: Self-pay | Admitting: Emergency Medicine

## 2014-05-03 VITALS — BP 145/91 | HR 70 | Temp 97.6°F | Resp 16 | Ht 72.0 in | Wt 246.4 lb

## 2014-05-03 DIAGNOSIS — I10 Essential (primary) hypertension: Secondary | ICD-10-CM | POA: Diagnosis not present

## 2014-05-03 DIAGNOSIS — Z566 Other physical and mental strain related to work: Secondary | ICD-10-CM | POA: Diagnosis not present

## 2014-05-03 DIAGNOSIS — E785 Hyperlipidemia, unspecified: Secondary | ICD-10-CM

## 2014-05-03 NOTE — Progress Notes (Signed)
Subjective:  This chart was scribed for Darlyne Russian, MD by Tamsen Roers, at [facility].  This patient was seen in room 21 and the patient's care was started at 8:09 AM.    Patient ID: Joshua [name], male    DOB: 1955-09-22, 59 y.o.   MRN: [id number]  HPI  HPI Comments: Joshua [name] is a 59 y.o. male who presents to [facility] for a checkup.  Patient saw his cardiologist who gave him tests and notes that everything checked out okay.  Patient has not yet started his exercising but plans on walking since the nice weather is starting.  He notes his blood pressure was a bit higher this morning because he was at work earlier.  Patient is compliant with his medication and takes baby aspirin.  He states that [place] works well for his seasonal allergies. Patient has not had anything to eat this morning. Patient has had a sleep study done 5-6 years ago.  Patient does not have any other complaints today.   BP left arm: 124/84    Patient Active Problem List   Diagnosis Date Noted  . Hyperlipidemia 11/30/2013  . Morbid obesity 11/30/2013  . DOE (dyspnea on exertion) 11/30/2013  . Other and unspecified hyperlipidemia 11/02/2013  . Stress at work 11/02/2013  . Erectile dysfunction 08/16/2012  . Hypertension 08/04/2011   Past Medical History  Diagnosis Date  . Allergy   . Hypertension    Past Surgical History  Procedure Laterality Date  . Eye surgery    . Ankle surgery Right     no other information given   No Known Allergies Prior to Admission medications   Medication Sig Start Date End Date Taking? Authorizing Provider  amLODipine (NORVASC) 5 MG tablet Take 1 tablet (5 mg total) by mouth daily. 11/20/13  Yes Darlyne Russian, MD  atorvastatin (LIPITOR) 40 MG tablet TAKE 1 TABLET EVERY DAY 03/27/14  Yes Darlyne Russian, MD  losartan (COZAAR) 100 MG tablet Take 1 tablet (100 mg total) by mouth daily. 11/02/13  Yes Darlyne Russian, MD    History   Social History  . Marital Status: Single    Spouse Name: N/A  . Number of Children: N/A  . Years of Education: N/A   Occupational History  . [employer]    Social History Main Topics  . Smoking status: Never Smoker   . Smokeless tobacco: Not on file  . Alcohol Use: No  . Drug Use: No  . Sexual Activity: Yes   Other Topics Concern  . Not on file   Social History Narrative   Married. Education: [employer].       Review of Systems  Constitutional: Negative for fever and chills.  HENT: Negative for congestion and nosebleeds.   Respiratory: Negative for shortness of breath.   Cardiovascular: Negative for chest pain.  Gastrointestinal: Negative for nausea, vomiting and diarrhea.       Objective:   Physical Exam CONSTITUTIONAL: Well developed/well nourished HEAD: Normocephalic/atraumatic EYES: EOMI/PERRL ENMT: Mucous membranes moist NECK: supple no meningeal signs SPINE/BACK:entire spine nontender CV: S1/S2 noted, no murmurs/rubs/gallops noted LUNGS: Lungs are clear to auscultation bilaterally, no apparent distress ABDOMEN: soft, nontender, no rebound or guarding, bowel sounds noted throughout abdomen GU:no cva tenderness NEURO: Pt is awake/alert/appropriate, moves all extremitiesx4.  No facial droop.   EXTREMITIES: pulses normal/equal, full ROM SKIN: warm, color normal PSYCH: no abnormalities of mood noted, alert and  oriented to situation   Filed Vitals:   05/03/14 0759  BP: 145/91  Pulse: 70  Temp: 97.6 F (36.4 C)  TempSrc: Oral  Resp: 16  Height: 6' (1.829 m)  Weight: 246 lb 6.4 oz (111.766 kg)  SpO2: 99%        Assessment & Plan:  Patient looks good. He has been to the cardiologist for a checkup. I encouraged him to work harder on his exercise and attempts at weight loss. I repeated his blood pressure and it was normal.I personally performed the services described in this documentation, which was scribed in my presence. The  recorded information has been reviewed and is accurate.

## 2014-05-17 ENCOUNTER — Other Ambulatory Visit: Payer: Self-pay | Admitting: Emergency Medicine

## 2014-05-17 ENCOUNTER — Other Ambulatory Visit: Payer: Self-pay | Admitting: Physician Assistant

## 2014-08-18 ENCOUNTER — Ambulatory Visit (INDEPENDENT_AMBULATORY_CARE_PROVIDER_SITE_OTHER): Payer: 59 | Admitting: Physician Assistant

## 2014-08-18 VITALS — BP 124/74 | HR 92 | Temp 98.1°F | Resp 16 | Ht 72.5 in | Wt 249.4 lb

## 2014-08-18 DIAGNOSIS — J40 Bronchitis, not specified as acute or chronic: Secondary | ICD-10-CM | POA: Diagnosis not present

## 2014-08-18 DIAGNOSIS — R059 Cough, unspecified: Secondary | ICD-10-CM

## 2014-08-18 DIAGNOSIS — R05 Cough: Secondary | ICD-10-CM | POA: Diagnosis not present

## 2014-08-18 MED ORDER — HYDROCOD POLST-CPM POLST ER 10-8 MG/5ML PO SUER: 5.0000 mL | Freq: Every evening | ORAL | Status: AC | PRN

## 2014-08-18 MED ORDER — AZITHROMYCIN 250 MG PO TABS: ORAL_TABLET | ORAL | Status: DC

## 2014-08-18 MED ORDER — GUAIFENESIN ER 1200 MG PO TB12: 1.0000 | ORAL_TABLET | Freq: Two times a day (BID) | ORAL | Status: DC | PRN

## 2014-08-18 MED ORDER — AMOXICILLIN-POT CLAVULANATE 875-125 MG PO TABS: 1.0000 | ORAL_TABLET | Freq: Two times a day (BID) | ORAL | Status: AC

## 2014-08-18 NOTE — Progress Notes (Signed)
[facility] [address], [place] [address] [phone]  Date:  08/18/2014   Name:  Joshua [name]   DOB:  Oct 05, 1955   MRN:  [id number]  PCP:  Jenny Reichmann, MD    History of Present Illness:  CARNIE BRUEMMER is a 59 y.o. male patient who presents to [facility] for chief complaint of sore throat and a worsening cough.  He has had a productive cough that is worse at night.  It is a pale yellow sputum.  He has had no fever, but feels fatigue and malaise.  He denies nasal congestion, ear fullness, shortness of breath, or dyspnea.  There is no chest pain, dizziness, or chest tightness.  He has taken corticeptin without much relief, and Nyquil last night to help his cough.     Patient Active Problem List   Diagnosis Date Noted  . Hyperlipidemia 11/30/2013  . Morbid obesity 11/30/2013  . DOE (dyspnea on exertion) 11/30/2013  . Other and unspecified hyperlipidemia 11/02/2013  . Stress at work 11/02/2013  . Erectile dysfunction 08/16/2012  . Hypertension 08/04/2011    Past Medical History  Diagnosis Date  . Allergy   . Hypertension     Past Surgical History  Procedure Laterality Date  . Eye surgery    . Ankle surgery Right     no other information given    History  Substance Use Topics  . Smoking status: Never Smoker   . Smokeless tobacco: Not on file  . Alcohol Use: No    Family History  Problem Relation Age of Onset  . Hypertension Mother   . Hyperlipidemia Mother   . Cancer Father   . Heart disease Father     No Known Allergies  Medication list has been reviewed and updated.  Current Outpatient Prescriptions on File Prior to Visit  Medication Sig Dispense Refill  . amLODipine (NORVASC) 5 MG tablet TAKE 1 TABLET (5 MG TOTAL) BY MOUTH DAILY. 30 tablet 5  . atorvastatin (LIPITOR) 40 MG tablet TAKE 1 TABLET EVERY DAY 30 tablet 1  . losartan (COZAAR) 100 MG tablet Take 1 tablet (100 mg total) by mouth daily. 90 tablet 3  . atorvastatin  (LIPITOR) 40 MG tablet TAKE 1 TABLET EVERY DAY (Patient not taking: Reported on 08/18/2014) 90 tablet 1   No current facility-administered medications on file prior to visit.    ROS ROS otherwise unremarkable unless listed above.   Physical Examination: BP 124/74 mmHg  Pulse 92  Temp(Src) 98.1 F (36.7 C) (Oral)  Resp 16  Ht 6' 0.5" (1.842 m)  Wt 249 lb 6.4 oz (113.127 kg)  BMI 33.34 kg/m2  SpO2 97% Ideal Body Weight: Weight in (lb) to have BMI = 25: 186.5  Physical Exam Alert, cooperative, and oriented x4.  Conjunctiva normal.  External ear to tympanic membrane normal bilaterally.  Nasal mucosa pink with rhinorrhea.  Oral mucosa is normal without tonsillar edema.  Breath sounds with rhonchi, but no wheezing or crackles.  Heart with regular rate and rhythm.      Assessment and Plan: 59 year old male with past medical history listed above is here today for chief complaint of sore throat and cough. His lungs circulated air well. There are some rhonchi heard with expiration. After 7 days of worsening symptoms, I will treat him with an antibiotic at this time. He will take the Z-Pak. I have also issued him cough medication at nighttime. I have also advised him to take  Mucinex and to hydrate with over 64 ounces of water per day. I have advised him of alarming symptoms that warrant an immediate return.   Bronchitis - Plan: amoxicillin-clavulanate (AUGMENTIN) 875-125 MG per tablet, azithromycin (ZITHROMAX) 250 MG tablet, Guaifenesin (MUCINEX MAXIMUM STRENGTH) 1200 MG TB12  Cough - Plan: chlorpheniramine-HYDROcodone (TUSSIONEX PENNKINETIC ER) 10-8 MG/5ML SUER, azithromycin (ZITHROMAX) 250 MG tablet, Guaifenesin (MUCINEX MAXIMUM STRENGTH) 1200 MG TB12   Ivar Drape, PA-C [facility] 08/18/2014 8:22 AM

## 2014-08-18 NOTE — Patient Instructions (Signed)
Upper Respiratory Infection, Adult An upper respiratory infection (URI) is also sometimes known as the common cold. The upper respiratory tract includes the nose, sinuses, throat, trachea, and bronchi. Bronchi are the airways leading to the lungs. Most people improve within 1 week, but symptoms can last up to 2 weeks. A residual cough may last even longer.  CAUSES Many different viruses can infect the tissues lining the upper respiratory tract. The tissues become irritated and inflamed and often become very moist. Mucus production is also common. A cold is contagious. You can easily spread the virus to others by oral contact. This includes kissing, sharing a glass, coughing, or sneezing. Touching your mouth or nose and then touching a surface, which is then touched by another person, can also spread the virus. SYMPTOMS  Symptoms typically develop 1 to 3 days after you come in contact with a cold virus. Symptoms vary from person to person. They may include:  Runny nose.  Sneezing.  Nasal congestion.  Sinus irritation.  Sore throat.  Loss of voice (laryngitis).  Cough.  Fatigue.  Muscle aches.  Loss of appetite.  Headache.  Low-grade fever. DIAGNOSIS  You might diagnose your own cold based on familiar symptoms, since most people get a cold 2 to 3 times a year. Your caregiver can confirm this based on your exam. Most importantly, your caregiver can check that your symptoms are not due to another disease such as strep throat, sinusitis, pneumonia, asthma, or epiglottitis. Blood tests, throat tests, and X-rays are not necessary to diagnose a common cold, but they may sometimes be helpful in excluding other more serious diseases. Your caregiver will decide if any further tests are required. RISKS AND COMPLICATIONS  You may be at risk for a more severe case of the common cold if you smoke cigarettes, have chronic heart disease (such as heart failure) or lung disease (such as asthma), or if  you have a weakened immune system. The very young and very old are also at risk for more serious infections. Bacterial sinusitis, middle ear infections, and bacterial pneumonia can complicate the common cold. The common cold can worsen asthma and chronic obstructive pulmonary disease (COPD). Sometimes, these complications can require emergency medical care and may be life-threatening. PREVENTION  The best way to protect against getting a cold is to practice good hygiene. Avoid oral or hand contact with people with cold symptoms. Wash your hands often if contact occurs. There is no clear evidence that vitamin C, vitamin E, echinacea, or exercise reduces the chance of developing a cold. However, it is always recommended to get plenty of rest and practice good nutrition. TREATMENT  Treatment is directed at relieving symptoms. There is no cure. Antibiotics are not effective, because the infection is caused by a virus, not by bacteria. Treatment may include:  Increased fluid intake. Sports drinks offer valuable electrolytes, sugars, and fluids.  Breathing heated mist or steam (vaporizer or shower).  Eating chicken soup or other clear broths, and maintaining good nutrition.  Getting plenty of rest.  Using gargles or lozenges for comfort.  Controlling fevers with ibuprofen or acetaminophen as directed by your caregiver.  Increasing usage of your inhaler if you have asthma. Zinc gel and zinc lozenges, taken in the first 24 hours of the common cold, can shorten the duration and lessen the severity of symptoms. Pain medicines may help with fever, muscle aches, and throat pain. A variety of non-prescription medicines are available to treat congestion and runny nose. Your caregiver   can make recommendations and may suggest nasal or lung inhalers for other symptoms.  HOME CARE INSTRUCTIONS   Only take over-the-counter or prescription medicines for pain, discomfort, or fever as directed by your  caregiver.  Use a warm mist humidifier or inhale steam from a shower to increase air moisture. This may keep secretions moist and make it easier to breathe.  Drink enough water and fluids to keep your urine clear or pale yellow.  Rest as needed.  Return to work when your temperature has returned to normal or as your caregiver advises. You may need to stay home longer to avoid infecting others. You can also use a face mask and careful hand washing to prevent spread of the virus. SEEK MEDICAL CARE IF:   After the first few days, you feel you are getting worse rather than better.  You need your caregiver's advice about medicines to control symptoms.  You develop chills, worsening shortness of breath, or brown or red sputum. These may be signs of pneumonia.  You develop yellow or brown nasal discharge or pain in the face, especially when you bend forward. These may be signs of sinusitis.  You develop a fever, swollen neck glands, pain with swallowing, or white areas in the back of your throat. These may be signs of strep throat. SEEK IMMEDIATE MEDICAL CARE IF:   You have a fever.  You develop severe or persistent headache, ear pain, sinus pain, or chest pain.  You develop wheezing, a prolonged cough, cough up blood, or have a change in your usual mucus (if you have chronic lung disease).  You develop sore muscles or a stiff neck. Document Released: 08/05/2000 Document Revised: 05/04/2011 Document Reviewed: 05/17/2013 ExitCare Patient Information 2015 [employer]. This information is not intended to replace advice given to you by your health care provider. Make sure you discuss any questions you have with your health care provider.  

## 2014-11-06 ENCOUNTER — Ambulatory Visit (INDEPENDENT_AMBULATORY_CARE_PROVIDER_SITE_OTHER): Payer: 59 | Admitting: Emergency Medicine

## 2014-11-06 ENCOUNTER — Encounter: Payer: Self-pay | Admitting: Emergency Medicine

## 2014-11-06 VITALS — BP 134/78 | HR 73 | Temp 98.0°F | Resp 16 | Ht 72.0 in | Wt 242.8 lb

## 2014-11-06 DIAGNOSIS — I10 Essential (primary) hypertension: Secondary | ICD-10-CM

## 2014-11-06 DIAGNOSIS — E785 Hyperlipidemia, unspecified: Secondary | ICD-10-CM | POA: Diagnosis not present

## 2014-11-06 DIAGNOSIS — N521 Erectile dysfunction due to diseases classified elsewhere: Secondary | ICD-10-CM

## 2014-11-06 DIAGNOSIS — Z0000 Encounter for general adult medical examination without abnormal findings: Secondary | ICD-10-CM

## 2014-11-06 DIAGNOSIS — Z1211 Encounter for screening for malignant neoplasm of colon: Secondary | ICD-10-CM

## 2014-11-06 LAB — CBC WITH DIFFERENTIAL/PLATELET
BASOS ABS: 0.1 10*3/uL (ref 0.0–0.1)
BASOS PCT: 1 % (ref 0–1)
EOS ABS: 0.2 10*3/uL (ref 0.0–0.7)
EOS PCT: 3 % (ref 0–5)
HCT: 41.5 % (ref 39.0–52.0)
Hemoglobin: 14.4 g/dL (ref 13.0–17.0)
Lymphocytes Relative: 33 % (ref 12–46)
Lymphs Abs: 2.1 10*3/uL (ref 0.7–4.0)
MCH: 29.7 pg (ref 26.0–34.0)
MCHC: 34.7 g/dL (ref 30.0–36.0)
MCV: 85.6 fL (ref 78.0–100.0)
MPV: 10.6 fL (ref 8.6–12.4)
Monocytes Absolute: 0.6 10*3/uL (ref 0.1–1.0)
Monocytes Relative: 9 % (ref 3–12)
NEUTROS PCT: 54 % (ref 43–77)
Neutro Abs: 3.5 10*3/uL (ref 1.7–7.7)
PLATELETS: 257 10*3/uL (ref 150–400)
RBC: 4.85 MIL/uL (ref 4.22–5.81)
RDW: 12.8 % (ref 11.5–15.5)
WBC: 6.4 10*3/uL (ref 4.0–10.5)

## 2014-11-06 LAB — POCT URINALYSIS DIPSTICK
Bilirubin, UA: NEGATIVE
Blood, UA: NEGATIVE
GLUCOSE UA: NEGATIVE
KETONES UA: NEGATIVE
Leukocytes, UA: NEGATIVE
Nitrite, UA: NEGATIVE
Protein, UA: NEGATIVE
SPEC GRAV UA: 1.025
Urobilinogen, UA: 0.2
pH, UA: 5.5

## 2014-11-06 LAB — POCT UA - MICROSCOPIC ONLY
CASTS, UR, LPF, POC: NEGATIVE
CRYSTALS, UR, HPF, POC: NEGATIVE
Epithelial cells, urine per micros: NEGATIVE
Mucus, UA: POSITIVE
Yeast, UA: NEGATIVE

## 2014-11-06 LAB — LIPID PANEL
CHOLESTEROL: 121 mg/dL — AB (ref 125–200)
HDL: 47 mg/dL (ref >=40)
LDL CALC: 60 mg/dL (ref <130)
TRIGLYCERIDES: 72 mg/dL (ref <150)
Total CHOL/HDL Ratio: 2.6 Ratio (ref <=5.0)
VLDL: 14 mg/dL (ref <30)

## 2014-11-06 LAB — COMPLETE METABOLIC PANEL WITH GFR
ALT: 26 U/L (ref 9–46)
AST: 19 U/L (ref 10–35)
Albumin: 4.3 g/dL (ref 3.6–5.1)
Alkaline Phosphatase: 67 U/L (ref 40–115)
BUN: 21 mg/dL (ref 7–25)
CO2: 22 mmol/L (ref 20–31)
CREATININE: 1.05 mg/dL (ref 0.70–1.25)
Calcium: 9.1 mg/dL (ref 8.6–10.3)
Chloride: 106 mmol/L (ref 98–110)
GFR, Est African American: 89 mL/min (ref >=60)
GFR, Est Non African American: 77 mL/min (ref >=60)
Glucose, Bld: 99 mg/dL (ref 65–99)
Potassium: 4 mmol/L (ref 3.5–5.3)
SODIUM: 139 mmol/L (ref 135–146)
TOTAL PROTEIN: 6.6 g/dL (ref 6.1–8.1)
Total Bilirubin: 1.2 mg/dL (ref 0.2–1.2)

## 2014-11-06 MED ORDER — ATORVASTATIN CALCIUM 40 MG PO TABS: 40.0000 mg | ORAL_TABLET | Freq: Every day | ORAL | Status: DC

## 2014-11-06 MED ORDER — AMLODIPINE BESYLATE 5 MG PO TABS: ORAL_TABLET | ORAL | Status: DC

## 2014-11-06 MED ORDER — LOSARTAN POTASSIUM 100 MG PO TABS: 100.0000 mg | ORAL_TABLET | Freq: Every day | ORAL | Status: DC

## 2014-11-06 NOTE — Progress Notes (Addendum)
This chart was scribed for Nena Jordan, MD by [facility], medical scribe at [facility].The patient was seen in exam room 22 and the patient's care was started at 8:35 AM.  Chief Complaint:  Chief Complaint  Patient presents with  . Annual Exam   HPI: Joshua [name] is a 59 y.o. male who reports to [facility] today for an annual physical exam. Doing well today. Home life is good, married 3 years ago. Some exercise. Flu shots through work. Does not smoke. Compliant with medications. Work stress is about the same. Works for an Psychologist, forensic. Parents lives in [place], [place] on visiting in October for his mothers birthday. Family history of cancer, his father had lymphoma, also had triple bypass surgery.   Past Medical History  Diagnosis Date  . Allergy   . Hypertension    Past Surgical History  Procedure Laterality Date  . Eye surgery    . Ankle surgery Right     no other information given   Social History   Social History  . Marital Status: Single    Spouse Name: N/A  . Number of Children: N/A  . Years of Education: N/A   Occupational History  . Regional Mgr    Social History Main Topics  . Smoking status: Never Smoker   . Smokeless tobacco: None  . Alcohol Use: No  . Drug Use: No  . Sexual Activity: Yes   Other Topics Concern  . None   Social History Narrative   Married. Education: [employer].    Family History  Problem Relation Age of Onset  . Hypertension Mother   . Hyperlipidemia Mother   . Cancer Father   . Heart disease Father    No Known Allergies Prior to Admission medications   Medication Sig Start Date End Date Taking? Authorizing Provider  amLODipine (NORVASC) 5 MG tablet TAKE 1 TABLET (5 MG TOTAL) BY MOUTH DAILY. 05/18/14  Yes Darlyne Russian, MD  atorvastatin (LIPITOR) 40 MG tablet TAKE 1 TABLET EVERY DAY 03/27/14  Yes Darlyne Russian, MD  losartan (COZAAR) 100 MG tablet Take 1 tablet (100 mg total) by mouth daily. 11/02/13  Yes  Darlyne Russian, MD   ROS: The patient denies fevers, chills, night sweats, unintentional weight loss, chest pain, palpitations, wheezing, dyspnea on exertion, nausea, vomiting, abdominal pain, dysuria, hematuria, melena, numbness, weakness, or tingling.  All other systems have been reviewed and were otherwise negative with the exception of those mentioned in the HPI and as above.    PHYSICAL EXAM: Filed Vitals:   11/06/14 0821  BP: 134/78  Pulse: 73  Temp: 98 F (36.7 C)  Resp: 16   Body mass index is 32.92 kg/(m^2).  General: Alert, no acute distress HEENT:  Normocephalic, atraumatic, oropharynx patent. Eye: [name] Cardiovascular:  Regular rate and rhythm, no rubs murmurs or gallops.  No Carotid bruits, radial pulse intact. No pedal edema.  Respiratory: Clear to auscultation bilaterally.  No wheezes, rales, or rhonchi.  No cyanosis, no use of accessory musculature Abdominal: No organomegaly, abdomen is soft and non-tender, positive bowel sounds.  No masses. Musculoskeletal: Gait intact. No edema, tenderness Skin: No rashes. Neurologic: Facial musculature symmetric. Psychiatric: Patient acts appropriately throughout our interaction. Lymphatic: No cervical or submandibular lymphadenopathy Genitourinary/Anorectal: No acute findings  LABS:  ASSESSMENT/PLAN: 1. Annual physical exam Physical exam is normal except for being overweight and not exercising enough. I did advise him to take a baby  aspirin daily along with his other medications due to a family history of heart disease. He has his own hypertension and hyperlipidemia. He did have removal of a skin lesion on his back. This was not malignant but severe atypia. - CBC with Differential/Platelet - COMPLETE METABOLIC PANEL WITH GFR - POCT UA - Microscopic Only - POCT urinalysis dipstick - POC Hemoccult Bld/Stl (3-Cd Home Screen); Future - Lipid panel - PSA  2. Essential hypertension  - COMPLETE METABOLIC PANEL WITH  GFR - losartan (COZAAR) 100 MG tablet; Take 1 tablet (100 mg total) by mouth daily.  Dispense: 90 tablet; Refill: 3  3. Hyperlipidemia Continue Lipitor. - Lipid panel  4. Erectile dysfunction due to diseases classified elsewhere  - PSA  5. Special screening for malignant neoplasms, colon  - Ambulatory referral to Gastroenterology   I personally performed the services described in this documentation, which was scribed in my presence. The recorded information has been reviewed and is accurate.  Arlyss Queen, MD  [facility]  11/06/2014 8:59 AM Gross sideeffects, risk and benefits, and alternatives of medications d/w patient. Patient is aware that all medications have potential sideeffects and we are unable to predict every sideeffect or drug-drug interaction that may occur.   Arlyss Queen MD 11/06/2014 8:25 AM

## 2014-11-06 NOTE — Patient Instructions (Signed)

## 2014-11-06 NOTE — Progress Notes (Deleted)
   Subjective:    Patient ID: Joshua [name], male    DOB: Dec 05, 1955, 59 y.o.   MRN: [id number]  HPI    Review of Systems  Constitutional: Negative.   HENT: Negative.   Eyes: Negative.   Respiratory: Negative.   Cardiovascular: Negative.   Gastrointestinal: Negative.   Endocrine: Negative.   Genitourinary: Negative.   Musculoskeletal: Negative.   Skin: Negative.   Allergic/Immunologic: Negative.   Neurological: Negative.   Hematological: Negative.   Psychiatric/Behavioral: Negative.        Objective:   Physical Exam        Assessment & Plan:

## 2014-11-07 LAB — PSA: PSA: 1.18 ng/mL (ref <=4.00)

## 2014-11-27 ENCOUNTER — Encounter: Payer: Self-pay | Admitting: Emergency Medicine

## 2015-01-29 ENCOUNTER — Ambulatory Visit (INDEPENDENT_AMBULATORY_CARE_PROVIDER_SITE_OTHER): Payer: 59 | Admitting: Family Medicine

## 2015-01-29 ENCOUNTER — Ambulatory Visit (INDEPENDENT_AMBULATORY_CARE_PROVIDER_SITE_OTHER): Payer: 59

## 2015-01-29 VITALS — BP 126/84 | HR 80 | Temp 98.2°F | Resp 16 | Ht 72.0 in | Wt 249.0 lb

## 2015-01-29 DIAGNOSIS — S6991XA Unspecified injury of right wrist, hand and finger(s), initial encounter: Secondary | ICD-10-CM

## 2015-01-29 DIAGNOSIS — R059 Cough, unspecified: Secondary | ICD-10-CM

## 2015-01-29 DIAGNOSIS — J209 Acute bronchitis, unspecified: Secondary | ICD-10-CM | POA: Diagnosis not present

## 2015-01-29 DIAGNOSIS — R05 Cough: Secondary | ICD-10-CM

## 2015-01-29 MED ORDER — HYDROCOD POLST-CPM POLST ER 10-8 MG/5ML PO SUER: 5.0000 mL | Freq: Every evening | ORAL | Status: AC | PRN

## 2015-01-29 MED ORDER — AZITHROMYCIN 250 MG PO TABS: ORAL_TABLET | ORAL | Status: DC

## 2015-01-29 MED ORDER — MELOXICAM 15 MG PO TABS: 15.0000 mg | ORAL_TABLET | Freq: Every day | ORAL | Status: DC

## 2015-01-29 NOTE — Progress Notes (Signed)
[facility] [address] [place] [address] [phone]  Date:  01/29/2015   Name:  Joshua [name]   DOB:  11-15-1955   MRN:  [id number]  PCP:  Jenny Reichmann, MD    History of Present Illness:  Joshua [name] is a 59 y.o. male patient who presents to [facility] for cc of wrist pain for 1 month, and chest congestion for 1 week. --He was at driving range where he dug into the ground in full stroke.  Wrist hurts when rotating it 20-30lbs hard to hold on.  No swelling, redness, numbness, or tingling, throughout injury.  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  Coughing worse at night, and moving a lot sparks the cough.  Non-productive cough, nasal congestion, no fever.  Feels fatigued.  No sob or dyspnea.  Patient Active Problem List   Diagnosis Date Noted  . Hyperlipidemia 11/30/2013  . Morbid obesity ([facility]) 11/30/2013  . DOE (dyspnea on exertion) 11/30/2013  . Other and unspecified hyperlipidemia 11/02/2013  . Stress at work 11/02/2013  . Erectile dysfunction 08/16/2012  . Hypertension 08/04/2011    Past Medical History  Diagnosis Date  . Allergy   . Hypertension     Past Surgical History  Procedure Laterality Date  . Eye surgery    . Ankle surgery Right     no other information given    Social History  Substance Use Topics  . Smoking status: Never Smoker   . Smokeless tobacco: None  . Alcohol Use: No    Family History  Problem Relation Age of Onset  . Hypertension Mother   . Hyperlipidemia Mother   . Cancer Father   . Heart disease Father   . Hyperlipidemia Father   . Hypertension Father     No Known Allergies  Medication list has been reviewed and updated.  Current Outpatient Prescriptions on File Prior to Visit  Medication Sig Dispense Refill  . amLODipine (NORVASC) 5 MG tablet TAKE 1 TABLET (5 MG TOTAL) BY MOUTH DAILY. 90 tablet 3  .  atorvastatin (LIPITOR) 40 MG tablet Take 1 tablet (40 mg total) by mouth daily. 90 tablet 3  . losartan (COZAAR) 100 MG tablet Take 1 tablet (100 mg total) by mouth daily. 90 tablet 3   No current facility-administered medications on file prior to visit.    ROS ROS otherwise unremarkable unless listed above.  Physical Examination: BP 126/84 mmHg  Pulse 80  Temp(Src) 98.2 F (36.8 C) (Oral)  Resp 16  Ht 6' (1.829 m)  Wt 249 lb (112.946 kg)  BMI 33.76 kg/m2  SpO2 98% Ideal Body Weight: Weight in (lb) to have BMI = 25: 183.9  Physical Exam  Constitutional: He is oriented to person, place, and time. He appears well-developed and well-nourished. No distress.  HENT:  Head: Normocephalic and atraumatic.  Right Ear: Tympanic membrane, external ear and ear canal normal.  Left Ear: Tympanic membrane, external ear and ear canal normal.  Nose: Rhinorrhea present. No mucosal edema. Right sinus exhibits no maxillary sinus tenderness and no frontal sinus tenderness. Left sinus exhibits no maxillary sinus tenderness and no frontal sinus tenderness.  Mouth/Throat: Posterior oropharyngeal erythema (mild) present. No oropharyngeal exudate or posterior oropharyngeal edema.  Eyes: Conjunctivae and EOM are normal. Pupils are equal, round, and reactive to light.  Cardiovascular: Normal rate and regular rhythm.  Exam  reveals no gallop and no friction rub.   No murmur heard. Pulmonary/Chest: Effort normal. No respiratory distress. He has no decreased breath sounds. He has no wheezes. He has no rhonchi (rhonchi).  Musculoskeletal:       Right wrist: He exhibits tenderness (tender with squeezing the wrist, and with passive and resisted pronation). He exhibits normal range of motion, no bony tenderness, no swelling and no crepitus.  Neurological: He is alert and oriented to person, place, and time.  Skin: Skin is warm and dry. He is not diaphoretic.  Psychiatric: He has a normal mood and affect. His  behavior is normal.     Assessment and Plan: Joshua [name] is a 59 y.o. male who is here today for cough, and a wrist injury. Treating bronchitis with abx Advised wrist splint.  Given anti-inflammatory and advised, both verbally and in handout, of wrist stretches.  He will pick up a wrist device at a pharmacy.   Acute bronchitis, unspecified organism - Plan: azithromycin (ZITHROMAX) 250 MG tablet, chlorpheniramine-HYDROcodone (TUSSIONEX PENNKINETIC ER) 10-8 MG/5ML SUER  Right wrist injury, initial encounter - Plan: DG Wrist Complete Right, meloxicam (MOBIC) 15 MG tablet  Cough - Plan: chlorpheniramine-HYDROcodone (TUSSIONEX PENNKINETIC ER) 10-8 MG/5ML SUER   Ivar Drape, PA-C [facility] 01/29/2015 8:25 AM

## 2015-01-29 NOTE — Patient Instructions (Addendum)
Please use the flonase at home.  Hydrate well.  Please get a wrist brace at the pharmacy ([facility], [facility], [facility], etc.).  If you have difficulty finding this please let me know.   Please perform these exercises.  Choose 3 pictures and do full exercise 3 times per day.   Do not take ibuprofen or naproxen with the mobic.  You can take tylenol.   Generic Wrist Exercises RANGE OF MOTION (ROM) AND STRETCHING EXERCISES - Wrist Sprain  These exercises may help you when beginning to rehabilitate your injury. Your symptoms may resolve with or without further involvement from your physician, physical therapist, or athletic trainer. While completing these exercises, remember:   Restoring tissue flexibility helps normal motion to return to the joints. This allows healthier, less painful movement and activity.  An effective stretch should be held for at least 30 seconds.  A stretch should never be painful. You should only feel a gentle lengthening or release in the stretched tissue. RANGE OF MOTION - Wrist Flexion, Active-Assisted  Extend your right / left elbow with your palm pointing down.*  Gently pull the back of your hand toward you until you feel a gentle stretch on the top of your forearm.  Hold this position for __________ seconds. Repeat __________ times. Complete this exercise __________ times per day.  *If directed by your physician, physical therapist, or athletic trainer, complete this stretch with your elbow bent rather than extended. RANGE OF MOTION - Wrist Extension, Active-Assisted   Extend your right / left elbow and turn your palm upward.*  Gently pull your palm/fingertips back so your wrist extends and your fingers point more toward the ground.  You should feel a gentle stretch on the inside of your forearm.  Hold this position for __________ seconds. Repeat __________ times. Complete this exercise __________ times per day. *If directed by your physician, physical  therapist, or athletic trainer, complete this stretch with your elbow bent, rather than extended. RANGE OF MOTION - Supination, Active   Stand or sit with your elbows at your side. Bend your right / left elbow to 90 degrees.  Turn your palm upward until you feel a gentle stretch on the inside of your forearm.  Hold this position for __________ seconds. Slowly release and return to the starting position. Repeat __________ times. Complete this stretch __________ times per day.  RANGE OF MOTION - Pronation, Active   Stand or sit with your elbows at your side. Bend your right / left elbow to 90 degrees.  Turn your palm downward until you feel a gentle stretch on the top of your forearm.  Hold this position for __________ seconds. Slowly release and return to the starting position. Repeat __________ times. Complete this stretch __________ times per day.  STRENGTHENING EXERCISES  These exercises may help you when beginning to rehabilitate your injury. They may resolve your symptoms with or without further involvement from your physician, physical therapist, or athletic trainer. While completing these exercises, remember:   Muscles can gain both the endurance and the strength needed for everyday activities through controlled exercises.  Complete these exercises as instructed by your physician, physical therapist, or athletic trainer. Progress the resistance and repetitions only as guided.  You may experience muscle soreness or fatigue, but the pain or discomfort you are trying to eliminate should never worsen during these exercises. If this pain does worsen, stop and make certain you are following the directions exactly. If the pain is still present after adjustments, discontinue  the exercise until you can discuss the trouble with your clinician. STRENGTH - Wrist Flexors  Sit with your right / left forearm palm-up and fully supported. Your elbow should be resting below the height of your  shoulder. Allow your wrist to extend over the edge of the surface.  Loosely holding a __________ weight or a piece of rubber exercise band/tubing, slowly curl your hand up toward your forearm.  Hold this position for __________ seconds. Slowly lower the wrist back to the starting position in a controlled manner. Repeat __________ times. Complete this exercise __________ times per day.  STRENGTH - Wrist Extensors  Sit with your right / left forearm palm-down and fully supported. Your elbow should be resting below the height of your shoulder. Allow your wrist to extend over the edge of the surface.  Loosely holding a __________ weight or a piece of rubber exercise band/tubing, slowly curl your hand up toward your forearm.  Hold this position for __________ seconds. Slowly lower the wrist back to the starting position in a controlled manner. Repeat __________ times. Complete this exercise __________ times per day.  STRENGTH - Forearm Supinators  Sit with your right / left forearm supported on a table, keeping your elbow below shoulder height. Rest your hand over the edge, palm down.  Gently grip a hammer or a soup ladle.  Without moving your elbow, slowly turn your palm and hand upward to a "thumbs-up" position.  Hold this position for __________ seconds. Slowly return to the starting position. Repeat __________ times. Complete this exercise __________ times per day.  STRENGTH - Forearm Pronators   Sit with your right / left forearm supported on a table, keeping your elbow below shoulder height. Rest your hand over the edge, palm up.  Gently grip a hammer or a soup ladle.  Without moving your elbow, slowly turn your palm and hand upward to a "thumbs-up" position.  Hold this position for __________ seconds. Slowly return to the starting position. Repeat __________ times. Complete this exercise __________ times per day.  STRENGTH - Grip  Grasp a tennis ball, a dense sponge, or a large,  rolled sock in your hand.  Squeeze as hard as you can without increasing any pain.  Hold this position for __________ seconds. Release your grip slowly. Repeat __________ times. Complete this exercise __________ times per day.    This information is not intended to replace advice given to you by your health care provider. Make sure you discuss any questions you have with your health care provider.   Document Released: 12/24/2004 Document Revised: 03/02/2014 Document Reviewed: 05/24/2008 Elsevier Interactive Patient Education 2016 Elsevier Inc[employer]   Acute Bronchitis Bronchitis is inflammation of the airways that extend from the windpipe into the lungs (bronchi). The inflammation often causes mucus to develop. This leads to a cough, which is the most common symptom of bronchitis.  In acute bronchitis, the condition usually develops suddenly and goes away over time, usually in a couple weeks. Smoking, allergies, and asthma can make bronchitis worse. Repeated episodes of bronchitis may cause further lung problems.  CAUSES Acute bronchitis is most often caused by the same virus that causes a cold. The virus can spread from person to person (contagious) through coughing, sneezing, and touching contaminated objects. SIGNS AND SYMPTOMS   Cough.   Fever.   Coughing up mucus.   Body aches.   Chest congestion.   Chills.   Shortness of breath.   Sore throat.  DIAGNOSIS  Acute bronchitis is  usually diagnosed through a physical exam. Your health care provider will also ask you questions about your medical history. Tests, such as chest X-rays, are sometimes done to rule out other conditions.  TREATMENT  Acute bronchitis usually goes away in a couple weeks. Oftentimes, no medical treatment is necessary. Medicines are sometimes given for relief of fever or cough. Antibiotic medicines are usually not needed but may be prescribed in certain situations. In some cases, an inhaler may be  recommended to help reduce shortness of breath and control the cough. A cool mist vaporizer may also be used to help thin bronchial secretions and make it easier to clear the chest.  HOME CARE INSTRUCTIONS  Get plenty of rest.   Drink enough fluids to keep your urine clear or pale yellow (unless you have a medical condition that requires fluid restriction). Increasing fluids may help thin your respiratory secretions (sputum) and reduce chest congestion, and it will prevent dehydration.   Take medicines only as directed by your health care provider.  If you were prescribed an antibiotic medicine, finish it all even if you start to feel better.  Avoid smoking and secondhand smoke. Exposure to cigarette smoke or irritating chemicals will make bronchitis worse. If you are a smoker, consider using nicotine gum or skin patches to help control withdrawal symptoms. Quitting smoking will help your lungs heal faster.   Reduce the chances of another bout of acute bronchitis by washing your hands frequently, avoiding people with cold symptoms, and trying not to touch your hands to your mouth, nose, or eyes.   Keep all follow-up visits as directed by your health care provider.  SEEK MEDICAL CARE IF: Your symptoms do not improve after 1 week of treatment.  SEEK IMMEDIATE MEDICAL CARE IF:  You develop an increased fever or chills.   You have chest pain.   You have severe shortness of breath.  You have bloody sputum.   You develop dehydration.  You faint or repeatedly feel like you are going to pass out.  You develop repeated vomiting.  You develop a severe headache. MAKE SURE YOU:   Understand these instructions.  Will watch your condition.  Will get help right away if you are not doing well or get worse.   This information is not intended to replace advice given to you by your health care provider. Make sure you discuss any questions you have with your health care provider.     Document Released: 03/19/2004 Document Revised: 03/02/2014 Document Reviewed: 08/02/2012 Elsevier Interactive Patient Education [employer].

## 2015-02-03 NOTE — Progress Notes (Signed)
Xray read and patient discussed with Ms. English. Agree with assessment and plan of care per her note.   

## 2015-03-04 ENCOUNTER — Ambulatory Visit (INDEPENDENT_AMBULATORY_CARE_PROVIDER_SITE_OTHER): Payer: 59 | Admitting: Physician Assistant

## 2015-03-04 ENCOUNTER — Encounter: Payer: Self-pay | Admitting: Physician Assistant

## 2015-03-04 VITALS — BP 144/80 | HR 74 | Temp 98.2°F | Resp 16 | Ht 72.0 in | Wt 250.0 lb

## 2015-03-04 DIAGNOSIS — R5383 Other fatigue: Secondary | ICD-10-CM

## 2015-03-04 DIAGNOSIS — R05 Cough: Secondary | ICD-10-CM

## 2015-03-04 DIAGNOSIS — G9331 Postviral fatigue syndrome: Secondary | ICD-10-CM

## 2015-03-04 DIAGNOSIS — G933 Postviral fatigue syndrome: Secondary | ICD-10-CM

## 2015-03-04 DIAGNOSIS — R059 Cough, unspecified: Secondary | ICD-10-CM

## 2015-03-04 DIAGNOSIS — J069 Acute upper respiratory infection, unspecified: Secondary | ICD-10-CM | POA: Diagnosis not present

## 2015-03-04 LAB — POCT CBC
GRANULOCYTE PERCENT: 64.8 % (ref 37–80)
HEMATOCRIT: 42.1 % — AB (ref 43.5–53.7)
Hemoglobin: 14.8 g/dL (ref 14.1–18.1)
Lymph, poc: 1.7 (ref 0.6–3.4)
MCH, POC: 29.9 pg (ref 27–31.2)
MCHC: 35.1 g/dL (ref 31.8–35.4)
MCV: 85.3 fL (ref 80–97)
MID (CBC): 0.7 (ref 0–0.9)
MPV: 7.5 fL (ref 0–99.8)
POC GRANULOCYTE: 4.4 (ref 2–6.9)
POC LYMPH %: 24.6 % (ref 10–50)
POC MID %: 10.6 %M (ref 0–12)
Platelet Count, POC: 209 10*3/uL (ref 142–424)
RBC: 4.93 M/uL (ref 4.69–6.13)
RDW, POC: 12.9 %
WBC: 6.8 10*3/uL (ref 4.6–10.2)

## 2015-03-04 MED ORDER — HYDROCOD POLST-CPM POLST ER 10-8 MG/5ML PO SUER: 5.0000 mL | Freq: Two times a day (BID) | ORAL | Status: DC | PRN

## 2015-03-04 MED ORDER — BECLOMETHASONE DIPROPIONATE 40 MCG/ACT IN AERS: 2.0000 | INHALATION_SPRAY | Freq: Two times a day (BID) | RESPIRATORY_TRACT | Status: DC

## 2015-03-04 NOTE — Progress Notes (Signed)
  Medical screening examination/treatment/procedure(s) were performed by non-physician practitioner and as supervising physician I was immediately available for consultation/collaboration.     

## 2015-03-04 NOTE — Patient Instructions (Signed)
Use inhaler 2 puffs twice a day for 2-4 weeks. Rinse mouth out after use. Cough syrup at night as needed. flonase for nasal congestion. Return in 2 weeks if symptoms have not improved.

## 2015-03-04 NOTE — Progress Notes (Signed)
[facility] [address] [place] [address] [phone]  Date:  03/04/2015   Name:  QUINTAVION AUSTRIA   DOB:  19-Jul-1955   MRN:  [id number]  PCP:  Jenny Reichmann, MD    Chief Complaint: Cough and Nasal Congestion   History of Present Illness:  This is a 60 y.o. male with PMH HLD, HTN who is presenting with cough and nasal congestion x 5 weeks. Was seen here 4 weeks ago and treated with for bronchitis with zpak and tussionex. Felt better after treated with zpak but was still coughing some. Symptoms never completely went away. 4 days ago symptoms worsened. He is having myalgias, dry cough (was productive initially), nasal congestion (new), fatigue. States his chest feels heavy when coughing. No wheezing or sob. Denies sore throat, otalgia, fever, chills,  Hasn't been taking anything for his symptoms since finishing the cough syrup and zpak. No hx asthma. Hx env allergies in spring/summer. No tob use.  Review of Systems:  Review of Systems See HPI  Patient Active Problem List   Diagnosis Date Noted  . Hyperlipidemia 11/30/2013  . Morbid obesity ([place]) 11/30/2013  . DOE (dyspnea on exertion) 11/30/2013  . Other and unspecified hyperlipidemia 11/02/2013  . Stress at work 11/02/2013  . Erectile dysfunction 08/16/2012  . Hypertension 08/04/2011    Prior to Admission medications   Medication Sig Start Date End Date Taking? Authorizing Provider  amLODipine (NORVASC) 5 MG tablet TAKE 1 TABLET (5 MG TOTAL) BY MOUTH DAILY. 11/06/14  Yes Darlyne Russian, MD  atorvastatin (LIPITOR) 40 MG tablet Take 1 tablet (40 mg total) by mouth daily. 11/06/14  Yes Darlyne Russian, MD  losartan (COZAAR) 100 MG tablet Take 1 tablet (100 mg total) by mouth daily. 11/06/14  Yes Darlyne Russian, MD    No Known Allergies  Past Surgical History  Procedure Laterality Date  . Eye surgery    . Ankle surgery Right     no other information given    Social History  Substance Use Topics   . Smoking status: Never Smoker   . Smokeless tobacco: Never Used  . Alcohol Use: No    Family History  Problem Relation Age of Onset  . Hypertension Mother   . Hyperlipidemia Mother   . Cancer Father   . Heart disease Father   . Hyperlipidemia Father   . Hypertension Father     Medication list has been reviewed and updated.  Physical Examination:  Physical Exam  Constitutional: He is oriented to person, place, and time. He appears well-developed and well-nourished. No distress.  HENT:  Head: Normocephalic and atraumatic.  Right Ear: Hearing, external ear and ear canal normal. A middle ear effusion is present.  Left Ear: Hearing, tympanic membrane, external ear and ear canal normal.  Nose: Mucosal edema and rhinorrhea present. Right sinus exhibits no maxillary sinus tenderness and no frontal sinus tenderness. Left sinus exhibits no maxillary sinus tenderness and no frontal sinus tenderness.  Mouth/Throat: Uvula is midline and mucous membranes are normal. Posterior oropharyngeal erythema present. No oropharyngeal exudate or posterior oropharyngeal edema.  Eyes: Conjunctivae and lids are normal. Right eye exhibits no discharge. Left eye exhibits no discharge. No scleral icterus.  Cardiovascular: Normal rate, regular rhythm, normal heart sounds and normal pulses.   No murmur heard. Pulmonary/Chest: Effort normal and breath sounds normal. No respiratory distress. He has no wheezes. He has no rhonchi. He has no rales.  Musculoskeletal: Normal range  of motion.  Lymphadenopathy:       Head (right side): No submental, no submandibular and no tonsillar adenopathy present.       Head (left side): No submental, no submandibular and no tonsillar adenopathy present.    He has no cervical adenopathy.  Neurological: He is alert and oriented to person, place, and time.  Skin: Skin is warm, dry and intact. No lesion and no rash noted.  Psychiatric: He has a normal mood and affect. His speech is  normal and behavior is normal. Thought content normal.    BP 144/80 mmHg  Pulse 74  Temp(Src) 98.2 F (36.8 C) (Oral)  Resp 16  Ht 6' (1.829 m)  Wt 250 lb (113.399 kg)  BMI 33.90 kg/m2  SpO2 98%  Results for orders placed or performed in visit on 03/04/15  POCT CBC  Result Value Ref Range   WBC 6.8 4.6 - 10.2 K/uL   Lymph, poc 1.7 0.6 - 3.4   POC LYMPH PERCENT 24.6 10 - 50 %L   MID (cbc) 0.7 0 - 0.9   POC MID % 10.6 0 - 12 %M   POC Granulocyte 4.4 2 - 6.9   Granulocyte percent 64.8 37 - 80 %G   RBC 4.93 4.69 - 6.13 M/uL   Hemoglobin 14.8 14.1 - 18.1 g/dL   HCT, POC 42.1 (A) 43.5 - 53.7 %   MCV 85.3 80 - 97 fL   MCH, POC 29.9 27 - 31.2 pg   MCHC 35.1 31.8 - 35.4 g/dL   RDW, POC 12.9 %   Platelet Count, POC 209 142 - 424 K/uL   MPV 7.5 0 - 99.8 fL    Assessment and Plan:  1. Cough 2. Postviral syndrome 3. Viral URI CBC wnl. Suspect postviral syndrome with superimposed new viral URI. Treat with qvar BID x 2-4 weeks depending on sx improvement and cough syrup prn. Return if symptoms not significantly improved in 2 weeks. - POCT CBC - chlorpheniramine-HYDROcodone (TUSSIONEX PENNKINETIC ER) 10-8 MG/5ML SUER; Take 5 mLs by mouth every 12 (twelve) hours as needed for cough.  Dispense: 100 mL; Refill: 0 - beclomethasone (QVAR) 40 MCG/ACT inhaler; Inhale 2 puffs into the lungs 2 (two) times daily.  Dispense: 1 Inhaler; Refill: 0   Benjaman Pott. Drenda Freeze, MHS [facility]  03/04/2015

## 2015-09-18 ENCOUNTER — Emergency Department (HOSPITAL_BASED_OUTPATIENT_CLINIC_OR_DEPARTMENT_OTHER)
Admission: EM | Admit: 2015-09-18 | Discharge: 2015-09-18 | Disposition: A | Discharge status: Home / Self Care | Payer: 59 | Attending: Emergency Medicine | Admitting: Emergency Medicine

## 2015-09-18 ENCOUNTER — Encounter (HOSPITAL_BASED_OUTPATIENT_CLINIC_OR_DEPARTMENT_OTHER): Payer: Self-pay | Admitting: *Deleted

## 2015-09-18 DIAGNOSIS — E785 Hyperlipidemia, unspecified: Secondary | ICD-10-CM | POA: Diagnosis not present

## 2015-09-18 DIAGNOSIS — M79605 Pain in left leg: Secondary | ICD-10-CM | POA: Diagnosis present

## 2015-09-18 DIAGNOSIS — L03116 Cellulitis of left lower limb: Secondary | ICD-10-CM

## 2015-09-18 DIAGNOSIS — Z79899 Other long term (current) drug therapy: Secondary | ICD-10-CM | POA: Insufficient documentation

## 2015-09-18 DIAGNOSIS — I10 Essential (primary) hypertension: Secondary | ICD-10-CM | POA: Insufficient documentation

## 2015-09-18 HISTORY — DX: Pure hypercholesterolemia, unspecified: E78.00

## 2015-09-18 MED ORDER — CEPHALEXIN 500 MG PO CAPS: 500.0000 mg | ORAL_CAPSULE | Freq: Three times a day (TID) | ORAL | 0 refills | Status: DC

## 2015-09-18 NOTE — ED Provider Notes (Addendum)
[facility]PT [facility] Provider Note   CSN: [id number] Arrival date & time: 09/18/15  1733  First Provider Contact:  None       History   Chief Complaint Chief Complaint  Patient presents with  . Leg Pain    HPI Joshua [name] is a 60 y.o. male.  Patient presents with a possible infection to his left leg. He states that he was in the [place] last week on a cruise. He went on an excursion to a waterfall and scraped his leg on a rock. This was in fresh water. He states that he has an abrasion to the leg that has gotten slightly more red around it. He denies any increased pain. He feels like it might be a little tightness in his calf from swelling. He denies any fevers. No vomiting. No drainage from the wound. His tetanus shot is up-to-date.    Leg Pain   Pertinent negatives include no numbness.    Past Medical History:  Diagnosis Date  . Allergy   . Hypercholesteremia   . Hypertension     Patient Active Problem List   Diagnosis Date Noted  . Hyperlipidemia 11/30/2013  . Morbid obesity ([place]) 11/30/2013  . DOE (dyspnea on exertion) 11/30/2013  . Other and unspecified hyperlipidemia 11/02/2013  . Stress at work 11/02/2013  . Erectile dysfunction 08/16/2012  . Hypertension 08/04/2011    Past Surgical History:  Procedure Laterality Date  . ANKLE SURGERY Right    no other information given  . EYE SURGERY         Home Medications    Prior to Admission medications   Medication Sig Start Date End Date Taking? Authorizing Provider  amLODipine (NORVASC) 5 MG tablet TAKE 1 TABLET (5 MG TOTAL) BY MOUTH DAILY. 11/06/14  Yes Darlyne Russian, MD  atorvastatin (LIPITOR) 40 MG tablet Take 1 tablet (40 mg total) by mouth daily. 11/06/14  Yes Darlyne Russian, MD  cephALEXin (KEFLEX) 500 MG capsule Take 1 capsule (500 mg total) by mouth 3 (three) times daily. 09/18/15   Malvin Johns, MD    Family History Family History  Problem Relation Age of Onset  .  Hypertension Mother   . Hyperlipidemia Mother   . Cancer Father   . Heart disease Father   . Hyperlipidemia Father   . Hypertension Father     Social History Social History  Substance Use Topics  . Smoking status: Never Smoker  . Smokeless tobacco: Never Used  . Alcohol use No     Allergies   Review of patient's allergies indicates no known allergies.   Review of Systems Review of Systems  Constitutional: Negative for fever.  Gastrointestinal: Negative for nausea and vomiting.  Musculoskeletal: Negative for arthralgias, back pain, joint swelling and neck pain.  Skin: Positive for wound.  Neurological: Negative for weakness, numbness and headaches.     Physical Exam Updated Vital Signs BP 147/96 (BP Location: Left Arm)   Pulse 72   Temp 98.3 F (36.8 C) (Oral)   Resp 16   Ht 6' (1.829 m)   Wt 246 lb (111.6 kg)   SpO2 98%   BMI 33.36 kg/m   Physical Exam  Constitutional: He is oriented to person, place, and time. He appears well-developed and well-nourished.  HENT:  Head: Normocephalic and atraumatic.  Neck: Normal range of motion. Neck supple.  Cardiovascular: Normal rate.   Pulmonary/Chest: Effort normal.  Musculoskeletal: He exhibits no edema or tenderness.  Neurological: He is  alert and oriented to person, place, and time.  Skin: Skin is warm and dry.  Patient has an abrasion to the anterior portion of the left lower leg overlying the mid tibia. There is a small amount of surrounding erythema. No induration or fluctuance. No drainage. No noticeable swelling noted to the leg.  Psychiatric: He has a normal mood and affect.       ED Treatments / Results  Labs (all labs ordered are listed, but only abnormal results are displayed) Labs Reviewed - No data to display  EKG  EKG Interpretation None       Radiology No results found.  Procedures Procedures (including critical care time)  Medications Ordered in ED Medications - No data to  display   Initial Impression / Assessment and Plan / ED Course  I have reviewed the triage vital signs and the nursing notes.  Pertinent labs & imaging results that were available during my care of the patient were reviewed by me and considered in my medical decision making (see chart for details).  Clinical Course    Patient presents with a small amount of erythema around a recent leg wound that happened 6 days ago. He states that the redness has spread a little bit. There is no evidence of an abscess. I will start him on Keflex. His tetanus shot is up-to-date. I have a low suspicion of water borne infections such as vibrio.  Final Clinical Impressions(s) / ED Diagnoses   Final diagnoses:  Cellulitis of left lower extremity    New Prescriptions New Prescriptions   CEPHALEXIN (KEFLEX) 500 MG CAPSULE    Take 1 capsule (500 mg total) by mouth 3 (three) times daily.     Malvin Johns, MD 09/18/15 [id number]    Malvin Johns, MD 09/18/15 [id number]

## 2015-09-18 NOTE — ED Triage Notes (Signed)
States he scraped left leg on a rock while swimming in ocean in [place] on Thursday. C/o redness around site and feels that leg is swollen.

## 2015-11-04 ENCOUNTER — Other Ambulatory Visit: Payer: Self-pay | Admitting: Emergency Medicine

## 2015-11-04 DIAGNOSIS — I10 Essential (primary) hypertension: Secondary | ICD-10-CM

## 2015-11-27 ENCOUNTER — Other Ambulatory Visit: Payer: Self-pay | Admitting: Emergency Medicine

## 2015-12-02 ENCOUNTER — Ambulatory Visit (INDEPENDENT_AMBULATORY_CARE_PROVIDER_SITE_OTHER): Payer: 59 | Admitting: Family Medicine

## 2015-12-02 VITALS — BP 132/84 | HR 76 | Temp 98.3°F | Resp 16 | Ht 72.75 in | Wt 245.0 lb

## 2015-12-02 DIAGNOSIS — Z0000 Encounter for general adult medical examination without abnormal findings: Secondary | ICD-10-CM | POA: Diagnosis not present

## 2015-12-02 DIAGNOSIS — Z23 Encounter for immunization: Secondary | ICD-10-CM | POA: Diagnosis not present

## 2015-12-02 DIAGNOSIS — Z1159 Encounter for screening for other viral diseases: Secondary | ICD-10-CM | POA: Diagnosis not present

## 2015-12-02 DIAGNOSIS — E782 Mixed hyperlipidemia: Secondary | ICD-10-CM

## 2015-12-02 DIAGNOSIS — I10 Essential (primary) hypertension: Secondary | ICD-10-CM

## 2015-12-02 DIAGNOSIS — Z114 Encounter for screening for human immunodeficiency virus [HIV]: Secondary | ICD-10-CM | POA: Diagnosis not present

## 2015-12-02 DIAGNOSIS — Z125 Encounter for screening for malignant neoplasm of prostate: Secondary | ICD-10-CM | POA: Diagnosis not present

## 2015-12-02 DIAGNOSIS — E663 Overweight: Secondary | ICD-10-CM | POA: Diagnosis not present

## 2015-12-02 LAB — HIV ANTIBODY (ROUTINE TESTING W REFLEX): HIV 1&2 Ab, 4th Generation: NONREACTIVE

## 2015-12-02 LAB — COMPLETE METABOLIC PANEL WITH GFR
ALBUMIN: 4.2 g/dL (ref 3.6–5.1)
ALK PHOS: 70 U/L (ref 40–115)
ALT: 27 U/L (ref 9–46)
AST: 21 U/L (ref 10–35)
BILIRUBIN TOTAL: 1 mg/dL (ref 0.2–1.2)
BUN: 12 mg/dL (ref 7–25)
CO2: 23 mmol/L (ref 20–31)
Calcium: 9.5 mg/dL (ref 8.6–10.3)
Chloride: 104 mmol/L (ref 98–110)
Creat: 1.1 mg/dL (ref 0.70–1.25)
GFR, EST NON AFRICAN AMERICAN: 72 mL/min (ref >=60)
GFR, Est African American: 83 mL/min (ref >=60)
GLUCOSE: 99 mg/dL (ref 65–99)
Potassium: 4.5 mmol/L (ref 3.5–5.3)
SODIUM: 141 mmol/L (ref 135–146)
TOTAL PROTEIN: 6.9 g/dL (ref 6.1–8.1)

## 2015-12-02 LAB — HEPATITIS C ANTIBODY: HCV AB: NEGATIVE

## 2015-12-02 LAB — LIPID PANEL
Cholesterol: 137 mg/dL (ref 125–200)
HDL: 49 mg/dL (ref >=40)
LDL Cholesterol: 70 mg/dL (ref <130)
Total CHOL/HDL Ratio: 2.8 Ratio (ref <=5.0)
Triglycerides: 88 mg/dL (ref <150)
VLDL: 18 mg/dL (ref <30)

## 2015-12-02 LAB — PSA: PSA: 1.1 ng/mL (ref <=4.0)

## 2015-12-02 MED ORDER — AMLODIPINE BESYLATE 5 MG PO TABS: 5.0000 mg | ORAL_TABLET | Freq: Every day | ORAL | 3 refills | Status: DC

## 2015-12-02 MED ORDER — ATORVASTATIN CALCIUM 40 MG PO TABS: 40.0000 mg | ORAL_TABLET | Freq: Every day | ORAL | 3 refills | Status: DC

## 2015-12-02 MED ORDER — LOSARTAN POTASSIUM 100 MG PO TABS: 100.0000 mg | ORAL_TABLET | Freq: Every day | ORAL | 3 refills | Status: DC

## 2015-12-02 NOTE — Progress Notes (Signed)
By signing my name below I, Joshua [name], attest that this documentation has been prepared under the direction and in the presence of Joshua Agreste, MD. Electonically Signed. Joshua [name], Scribe 12/02/2015 at 9:07 AM  Subjective:    Patient ID: Joshua [name], male    DOB: Sep 12, 1955, 60 y.o.   MRN: [id number]  Chief Complaint  Patient presents with  . Annual Exam  . Flu Vaccine    HPI Joshua [name] is a 60 y.o. male who presents to the [facility] for his annual physical exam. Pt has history of HTN, HLD, and obesity.  HTN Lab Results  Component Value Date   CREATININE 1.05 11/06/2014  Pt takes 5 mg norvask QD and 100mg  losartan QD. Medications have been doing well and keeping his BP well controlled. Pt denies any side effects from the BP medication. Pt also denies any SOB, CP, dizziness, palpitations, or HAs.     HLD Lab Results  Component Value Date   CHOL 121 (L) 11/06/2014   HDL 47 11/06/2014   LDLCALC 60 11/06/2014   TRIG 72 11/06/2014   CHOLHDL 2.6 11/06/2014   Lab Results  Component Value Date   ALT 26 11/06/2014   AST 19 11/06/2014   ALKPHOS 67 11/06/2014   BILITOT 1.2 11/06/2014  Pt reports having some muscle aches in his arms that he has had for a long time. Muscle aches have not been worsening and do not bother the pt.   Obesity Wt Readings from Last 3 Encounters:  12/02/15 245 lb (111.1 kg)  09/18/15 246 lb (111.6 kg)  03/04/15 250 lb (113.4 kg)   Body mass index is 32.55 kg/m.   Exercise Pt states he walks for exercise. Pt admits he does not exercise "as often as [he] should." Pt eats breakfast everyday.   Diet Pt states that he drinks soda daily and eats fast food occasionally. Pt drinks alcohol on rare occasions.   Cancer Screening Skin cancer Pt has had a mole removed in the past and is scheduled to see his dermatologist within the next few months. Colon cancer Last Colonoscopy was in 2005. Was normal at  that time. Today, pt states that he is planning on getting his next colonoscopy this year.  Prostate cancer Lab Results  Component Value Date   PSA 1.18 11/06/2014   PSA 1.00 11/02/2013   PSA 1.15 08/16/2012   Depression Screening Depression screen [facility] 2/9 12/02/2015 03/04/2015 01/29/2015 11/06/2014 08/18/2014  Decreased Interest 0 0 0 0 0  Down, Depressed, Hopeless 0 0 0 0 0  PHQ - 2 Score 0 0 0 0 0  Pt states that his family is doing well. Work is going well.   Vision  Visual Acuity Screening   Right eye Left eye Both eyes  Without correction: 20/30 20/40 20/25   With correction:     Pt has not seen his eye doctor this year. Pt willing to follow up with his eye doctor this year.   Dentist Pt has seen his dentist this year and states he sees his dentist regularly.   Immunizations Immunization History  Administered Date(s) Administered  . Influenza Split 11/24/2010, 11/23/2013  . Influenza,inj,Quad PF,36+ Mos 12/02/2015  Pt requesting flu shot today.   Pt denies ever having Hep C or HIV testing.   Pt has not eaten any food yet today.    Patient Active Problem List   Diagnosis Date Noted  . Hyperlipidemia 11/30/2013  . Morbid  obesity ([place]) 11/30/2013  . DOE (dyspnea on exertion) 11/30/2013  . Other and unspecified hyperlipidemia 11/02/2013  . Stress at work 11/02/2013  . Erectile dysfunction 08/16/2012  . Hypertension 08/04/2011   Past Medical History:  Diagnosis Date  . Allergy   . Hypercholesteremia   . Hypertension    Past Surgical History:  Procedure Laterality Date  . ANKLE SURGERY Right    no other information given  . EYE SURGERY     No Known Allergies Prior to Admission medications   Medication Sig Start Date End Date Taking? Authorizing Provider  amLODipine (NORVASC) 5 MG tablet TAKE 1 TABLET EVERY DAY 11/29/15   Chelle Jeffery, PA-C  atorvastatin (LIPITOR) 40 MG tablet Take 1 tablet (40 mg total) by mouth daily. 11/06/14   Darlyne Russian, MD  losartan  (COZAAR) 100 MG tablet TAKE 1 TABLET EVERY DAY 11/05/15   Darlyne Russian, MD   Social History   Social History  . Marital status: Single    Spouse name: N/A  . Number of children: N/A  . Years of education: N/A   Occupational History  . [employer]   Social History Main Topics  . Smoking status: Never Smoker  . Smokeless tobacco: Never Used  . Alcohol use No  . Drug use: No  . Sexual activity: Yes   Other Topics Concern  . Not on file   Social History Narrative   Married. Education: [employer].     Review of Systems 13 system ROS reviewed. Pt is positive for seasonal allergies. Otherwise all systems negative.     Objective:   Physical Exam  Constitutional: He is oriented to person, place, and time. He appears well-developed and well-nourished.  HENT:  Head: Normocephalic and atraumatic.  Eyes: EOM are normal. Pupils are equal, round, and reactive to light.  Neck: No JVD present. Carotid bruit is not present.  Cardiovascular: Normal rate, regular rhythm and normal heart sounds.  Exam reveals no gallop and no friction rub.   No murmur heard. Pulmonary/Chest: Effort normal and breath sounds normal. No accessory muscle usage. He has no decreased breath sounds. He has no wheezes. He has no rhonchi. He has no rales.  Musculoskeletal: He exhibits no edema.  Pt has trace non-pitting edema in his lower extremities.   Neurological: He is alert and oriented to person, place, and time.  Skin: Skin is warm and dry.  Well healed surgical scar on his rt ankle.   Psychiatric: He has a normal mood and affect.  Vitals reviewed.   Vitals:   12/02/15 0808  BP: 132/84  Pulse: 76  Resp: 16  Temp: 98.3 F (36.8 C)  TempSrc: Oral  SpO2: 96%  Weight: 245 lb (111.1 kg)  Height: 6' 0.75" (1.848 m)         Assessment & Plan:   Joshua [name] is a 60 y.o. male Annual physical exam  - -anticipatory guidance as below in AVS, screening labs above. Health maintenance  items as above in HPI discussed/recommended as applicable.   - Advised to follow-up with eye care provider  -Decrease sodas in diet  - Follow-up with dermatology as planned, and advised to call gastroenterology to schedule colonoscopy  Needs flu shot, Flu vaccine need - Plan: Flu Vaccine QUAD 36+ mos IM  Essential hypertension - Plan: COMPLETE METABOLIC PANEL WITH GFR, losartan (COZAAR) 100 MG tablet, amLODipine (NORVASC) 5 MG tablet, DISCONTINUED: amLODipine (NORVASC) 5 MG tablet  - Stable. Continue medications as  tolerating without new side effects. CMP pending  Mixed hyperlipidemia - Plan: COMPLETE METABOLIC PANEL WITH GFR, Lipid panel, atorvastatin (LIPITOR) 40 MG tablet  - Tolerating Lipitor without new side effects. Check lipid panel, refilled Lipitor same dose  Screening for prostate cancer - Plan: PSA  - We discussed pros and cons of prostate cancer screening, and after this discussion, he chose to have screening done. PSA obtained, and no concerning findings on DRE.   Screening for HIV (human immunodeficiency virus) - Plan: HIV antibody  Need for hepatitis C screening test - Plan: Hepatitis C antibody  Overweight  - Increased exercise, portion control, decrease in sodas were discussed.  Meds ordered this encounter  Medications  . losartan (COZAAR) 100 MG tablet    Sig: Take 1 tablet (100 mg total) by mouth daily.    Dispense:  90 tablet    Refill:  3  . DISCONTD: amLODipine (NORVASC) 5 MG tablet    Sig: Take 1 tablet (5 mg total) by mouth daily.    Dispense:  90 tablet    Refill:  3    No more refills without office visit  . atorvastatin (LIPITOR) 40 MG tablet    Sig: Take 1 tablet (40 mg total) by mouth daily.    Dispense:  90 tablet    Refill:  3  . amLODipine (NORVASC) 5 MG tablet    Sig: Take 1 tablet (5 mg total) by mouth daily.    Dispense:  90 tablet    Refill:  3   Patient Instructions   Call [facility] to schedule colonoscopy.  Follow up with  dermatology as planned.  Try to cut back on sodas, and increase activity and exercise to help with weight loss.  Let me know if you have any questions.    Keeping you healthy  Get these tests  Blood pressure- Have your blood pressure checked once a year by your healthcare provider.  Normal blood pressure is 120/80  Weight- Have your body mass index (BMI) calculated to screen for obesity.  BMI is a measure of body fat based on height and weight. You can also calculate your own BMI at ViewBanking.[email or web address].  Cholesterol- Have your cholesterol checked every year.  Diabetes- Have your blood sugar checked regularly if you have high blood pressure, high cholesterol, have a family history of diabetes or if you are overweight.  Screening for Colon Cancer- Colonoscopy starting at age 39.  Screening may begin sooner depending on your family history and other health conditions. Follow up colonoscopy as directed by your Gastroenterologist.  Screening for Prostate Cancer- Both blood work (PSA) and a rectal exam help screen for Prostate Cancer.  Screening begins at age 34 with African-American men and at age 8 with Caucasian men.  Screening may begin sooner depending on your family history.  Take these medicines  Aspirin- One aspirin daily can help prevent Heart disease and Stroke.  Flu shot- Every fall.  Tetanus- Every 10 years.  Zostavax- Once after the age of 31 to prevent Shingles.  Pneumonia shot- Once after the age of 78; if you are younger than 20, ask your healthcare provider if you need a Pneumonia shot.  Take these steps  Don't smoke- If you do smoke, talk to your doctor about quitting.  For tips on how to quit, go to www.smokefree.gov or call 1-800-QUIT-NOW.  Be physically active- Exercise 5 days a week for at least 30 minutes.  If you are not already physically active start  slow and gradually work up to 30 minutes of moderate physical activity.  Examples of moderate activity  include walking briskly, mowing the yard, dancing, swimming, bicycling, etc.  Eat a healthy diet- Eat a variety of healthy food such as fruits, vegetables, low fat milk, low fat cheese, yogurt, lean meant, poultry, fish, beans, tofu, etc. For more information go to www.thenutritionsource.org  Drink alcohol in moderation- Limit alcohol intake to less than two drinks a day. Never drink and drive.  Dentist- Brush and floss twice daily; visit your dentist twice a year.  Depression- Your emotional health is as important as your physical health. If you're feeling down, or losing interest in things you would normally enjoy please talk to your healthcare provider.  Eye exam- Visit your eye doctor every year.  Safe sex- If you may be exposed to a sexually transmitted infection, use a condom.  Seat belts- Seat belts can save your life; always wear one.  Smoke/Carbon Monoxide detectors- These detectors need to be installed on the appropriate level of your home.  Replace batteries at least once a year.  Skin cancer- When out in the sun, cover up and use sunscreen 15 SPF or higher.  Violence- If anyone is threatening you, please tell your healthcare provider.  Living Will/ Health care power of attorney- Speak with your healthcare provider and family.    IF you received an x-ray today, you will receive an invoice from [facility]. Please contact [facility] at [phone] with questions or concerns regarding your invoice.   IF you received labwork today, you will receive an invoice from [facility]. Please contact [facility] at [phone] with questions or concerns regarding your invoice.   Our billing staff will not be able to assist you with questions regarding bills from these companies.  You will be contacted with the lab results as soon as they are available. The fastest way to get your results is to activate your My Chart account. Instructions are  located on the last page of this paperwork. If you have not heard from [place] regarding the results in 2 weeks, please contact this office.    Influenza (Flu) Vaccine (Inactivated or Recombinant):  1. Why get vaccinated? Influenza ("flu") is a contagious disease that spreads around the [place] every year, usually between October and May. Flu is caused by influenza viruses, and is spread mainly by coughing, sneezing, and close contact. Anyone can get flu. Flu strikes suddenly and can last several days. Symptoms vary by age, but can include:  fever/chills  sore throat  muscle aches  fatigue  cough  headache  runny or stuffy nose Flu can also lead to pneumonia and blood infections, and cause diarrhea and seizures in children. If you have a medical condition, such as heart or lung disease, flu can make it worse. Flu is more dangerous for some people. Infants and young children, people 79 years of age and older, pregnant women, and people with certain health conditions or a weakened immune system are at greatest risk. Each year thousands of people in the [place] die from flu, and many more are hospitalized. Flu vaccine can:  keep you from getting flu,  make flu less severe if you do get it, and  keep you from spreading flu to your family and other people. 2. Inactivated and recombinant flu vaccines A dose of flu vaccine is recommended every flu season. Children 6 months through 46 years of age may need two doses during  the same flu season. Everyone else needs only one dose each flu season. Some inactivated flu vaccines contain a very small amount of a mercury-based preservative called thimerosal. Studies have not shown thimerosal in vaccines to be harmful, but flu vaccines that do not contain thimerosal are available. There is no live flu virus in flu shots. They cannot cause the flu. There are many flu viruses, and they are always changing. Each year a new flu vaccine is made  to protect against three or four viruses that are likely to cause disease in the upcoming flu season. But even when the vaccine doesn't exactly match these viruses, it may still provide some protection. Flu vaccine cannot prevent:  flu that is caused by a virus not covered by the vaccine, or  illnesses that look like flu but are not. It takes about 2 weeks for protection to develop after vaccination, and protection lasts through the flu season. 3. Some people should not get this vaccine Tell the person who is giving you the vaccine:  If you have any severe, life-threatening allergies. If you ever had a life-threatening allergic reaction after a dose of flu vaccine, or have a severe allergy to any part of this vaccine, you may be advised not to get vaccinated. Most, but not all, types of flu vaccine contain a small amount of egg protein.  If you ever had Guillain-Barre Syndrome (also called GBS). Some people with a history of GBS should not get this vaccine. This should be discussed with your doctor.  If you are not feeling well. It is usually okay to get flu vaccine when you have a mild illness, but you might be asked to come back when you feel better. 4. Risks of a vaccine reaction With any medicine, including vaccines, there is a chance of reactions. These are usually mild and go away on their own, but serious reactions are also possible. Most people who get a flu shot do not have any problems with it. Minor problems following a flu shot include:  soreness, redness, or swelling where the shot was given  hoarseness  sore, red or itchy eyes  cough  fever  aches  headache  itching  fatigue If these problems occur, they usually begin soon after the shot and last 1 or 2 days. More serious problems following a flu shot can include the following:  There may be a small increased risk of Guillain-Barre Syndrome (GBS) after inactivated flu vaccine. This risk has been estimated at 1 or  2 additional cases per million people vaccinated. This is much lower than the risk of severe complications from flu, which can be prevented by flu vaccine.  Young children who get the flu shot along with pneumococcal vaccine (PCV13) and/or DTaP vaccine at the same time might be slightly more likely to have a seizure caused by fever. Ask your doctor for more information. Tell your doctor if a child who is getting flu vaccine has ever had a seizure. Problems that could happen after any injected vaccine:  People sometimes faint after a medical procedure, including vaccination. Sitting or lying down for about 15 minutes can help prevent fainting, and injuries caused by a fall. Tell your doctor if you feel dizzy, or have vision changes or ringing in the ears.  Some people get severe pain in the shoulder and have difficulty moving the arm where a shot was given. This happens very rarely.  Any medication can cause a severe allergic reaction. Such reactions from  a vaccine are very rare, estimated at about 1 in a million doses, and would happen within a few minutes to a few hours after the vaccination. As with any medicine, there is a very remote chance of a vaccine causing a serious injury or death. The safety of vaccines is always being monitored. For more information, visit: http://www.aguilar.org/ 5. What if there is a serious reaction? What should I look for?  Look for anything that concerns you, such as signs of a severe allergic reaction, very high fever, or unusual behavior. Signs of a severe allergic reaction can include hives, swelling of the face and throat, difficulty breathing, a fast heartbeat, dizziness, and weakness. These would start a few minutes to a few hours after the vaccination. What should I do?  If you think it is a severe allergic reaction or other emergency that can't wait, call 9-1-1 and get the person to the nearest hospital. Otherwise, call your doctor.  Reactions should  be reported to the Vaccine Adverse Event Reporting System (VAERS). Your doctor should file this report, or you can do it yourself through the VAERS web site at www.vaers.SamedayNews.es, or by calling (718) 869-9803. VAERS does not give medical advice. 6. The National Vaccine Injury Compensation Program The [employer] Vaccine Injury Compensation Program (VICP) is a federal program that was created to compensate people who may have been injured by certain vaccines. Persons who believe they may have been injured by a vaccine can learn about the program and about filing a claim by calling 989-478-3870 or visiting the [place] website at GoldCloset.com.ee. There is a time limit to file a claim for compensation. 7. How can I learn more?  Ask your healthcare provider. He or she can give you the vaccine package insert or suggest other sources of information.  Call your local or state health department.  Contact the Centers for Disease Control and Prevention (CDC):  Call 714-805-0675 (1-800-CDC-INFO) or  Visit CDC's website at https://gibson.com/ Vaccine Information Statement Inactivated Influenza Vaccine (09/29/2013)   This information is not intended to replace advice given to you by your health care provider. Make sure you discuss any questions you have with your health care provider.   Document Released: 12/04/2005 Document Revised: 03/02/2014 Document Reviewed: 10/02/2013 Elsevier Interactive Patient Education [employer].    I personally performed the services described in this documentation, which was scribed in my presence. The recorded information has been reviewed and considered, and addended by me as needed.   Signed,   Merri Ray, MD [facility].  12/03/15 7:42 AM

## 2015-12-02 NOTE — Patient Instructions (Addendum)
Call [facility] to schedule colonoscopy.  Follow up with dermatology as planned.  Try to cut back on sodas, and increase activity and exercise to help with weight loss.  Let me know if you have any questions.    Keeping you healthy  Get these tests  Blood pressure- Have your blood pressure checked once a year by your healthcare provider.  Normal blood pressure is 120/80  Weight- Have your body mass index (BMI) calculated to screen for obesity.  BMI is a measure of body fat based on height and weight. You can also calculate your own BMI at ViewBanking.si.  Cholesterol- Have your cholesterol checked every year.  Diabetes- Have your blood sugar checked regularly if you have high blood pressure, high cholesterol, have a family history of diabetes or if you are overweight.  Screening for Colon Cancer- Colonoscopy starting at age 18.  Screening may begin sooner depending on your family history and other health conditions. Follow up colonoscopy as directed by your Gastroenterologist.  Screening for Prostate Cancer- Both blood work (PSA) and a rectal exam help screen for Prostate Cancer.  Screening begins at age 70 with African-American men and at age 34 with Caucasian men.  Screening may begin sooner depending on your family history.  Take these medicines  Aspirin- One aspirin daily can help prevent Heart disease and Stroke.  Flu shot- Every fall.  Tetanus- Every 10 years.  Zostavax- Once after the age of 45 to prevent Shingles.  Pneumonia shot- Once after the age of 86; if you are younger than 33, ask your healthcare provider if you need a Pneumonia shot.  Take these steps  Don't smoke- If you do smoke, talk to your doctor about quitting.  For tips on how to quit, go to www.smokefree.gov or call 1-800-QUIT-NOW.  Be physically active- Exercise 5 days a week for at least 30 minutes.  If you are not already physically active start slow and gradually work up to 30 minutes of  moderate physical activity.  Examples of moderate activity include walking briskly, mowing the yard, dancing, swimming, bicycling, etc.  Eat a healthy diet- Eat a variety of healthy food such as fruits, vegetables, low fat milk, low fat cheese, yogurt, lean meant, poultry, fish, beans, tofu, etc. For more information go to www.thenutritionsource.org  Drink alcohol in moderation- Limit alcohol intake to less than two drinks a day. Never drink and drive.  Dentist- Brush and floss twice daily; visit your dentist twice a year.  Depression- Your emotional health is as important as your physical health. If you're feeling down, or losing interest in things you would normally enjoy please talk to your healthcare provider.  Eye exam- Visit your eye doctor every year.  Safe sex- If you may be exposed to a sexually transmitted infection, use a condom.  Seat belts- Seat belts can save your life; always wear one.  Smoke/Carbon Monoxide detectors- These detectors need to be installed on the appropriate level of your home.  Replace batteries at least once a year.  Skin cancer- When out in the sun, cover up and use sunscreen 15 SPF or higher.  Violence- If anyone is threatening you, please tell your healthcare provider.  Living Will/ Health care power of attorney- Speak with your healthcare provider and family.    IF you received an x-ray today, you will receive an invoice from [facility]. Please contact [facility] at [phone] with questions or concerns regarding your invoice.   IF you received labwork today,  you will receive an invoice from [facility] [employer]. Please contact [facility] at [phone] with questions or concerns regarding your invoice.   Our billing staff will not be able to assist you with questions regarding bills from these companies.  You will be contacted with the lab results as soon as they are available. The fastest way to get your  results is to activate your My Chart account. Instructions are located on the last page of this paperwork. If you have not heard from [place] regarding the results in 2 weeks, please contact this office.    Influenza (Flu) Vaccine (Inactivated or Recombinant):  1. Why get vaccinated? Influenza ("flu") is a contagious disease that spreads around the [place] every year, usually between October and May. Flu is caused by influenza viruses, and is spread mainly by coughing, sneezing, and close contact. Anyone can get flu. Flu strikes suddenly and can last several days. Symptoms vary by age, but can include:  fever/chills  sore throat  muscle aches  fatigue  cough  headache  runny or stuffy nose Flu can also lead to pneumonia and blood infections, and cause diarrhea and seizures in children. If you have a medical condition, such as heart or lung disease, flu can make it worse. Flu is more dangerous for some people. Infants and young children, people 60 years of age and older, pregnant women, and people with certain health conditions or a weakened immune system are at greatest risk. Each year thousands of people in the [place] die from flu, and many more are hospitalized. Flu vaccine can:  keep you from getting flu,  make flu less severe if you do get it, and  keep you from spreading flu to your family and other people. 2. Inactivated and recombinant flu vaccines A dose of flu vaccine is recommended every flu season. Children 6 months through 31 years of age may need two doses during the same flu season. Everyone else needs only one dose each flu season. Some inactivated flu vaccines contain a very small amount of a mercury-based preservative called thimerosal. Studies have not shown thimerosal in vaccines to be harmful, but flu vaccines that do not contain thimerosal are available. There is no live flu virus in flu shots. They cannot cause the flu. There are many flu viruses, and  they are always changing. Each year a new flu vaccine is made to protect against three or four viruses that are likely to cause disease in the upcoming flu season. But even when the vaccine doesn't exactly match these viruses, it may still provide some protection. Flu vaccine cannot prevent:  flu that is caused by a virus not covered by the vaccine, or  illnesses that look like flu but are not. It takes about 2 weeks for protection to develop after vaccination, and protection lasts through the flu season. 3. Some people should not get this vaccine Tell the person who is giving you the vaccine:  If you have any severe, life-threatening allergies. If you ever had a life-threatening allergic reaction after a dose of flu vaccine, or have a severe allergy to any part of this vaccine, you may be advised not to get vaccinated. Most, but not all, types of flu vaccine contain a small amount of egg protein.  If you ever had Guillain-Barre Syndrome (also called GBS). Some people with a history of GBS should not get this vaccine. This should be discussed with your doctor.  If you are not feeling well.  It is usually okay to get flu vaccine when you have a mild illness, but you might be asked to come back when you feel better. 4. Risks of a vaccine reaction With any medicine, including vaccines, there is a chance of reactions. These are usually mild and go away on their own, but serious reactions are also possible. Most people who get a flu shot do not have any problems with it. Minor problems following a flu shot include:  soreness, redness, or swelling where the shot was given  hoarseness  sore, red or itchy eyes  cough  fever  aches  headache  itching  fatigue If these problems occur, they usually begin soon after the shot and last 1 or 2 days. More serious problems following a flu shot can include the following:  There may be a small increased risk of Guillain-Barre Syndrome (GBS) after  inactivated flu vaccine. This risk has been estimated at 1 or 2 additional cases per million people vaccinated. This is much lower than the risk of severe complications from flu, which can be prevented by flu vaccine.  Young children who get the flu shot along with pneumococcal vaccine (PCV13) and/or DTaP vaccine at the same time might be slightly more likely to have a seizure caused by fever. Ask your doctor for more information. Tell your doctor if a child who is getting flu vaccine has ever had a seizure. Problems that could happen after any injected vaccine:  People sometimes faint after a medical procedure, including vaccination. Sitting or lying down for about 15 minutes can help prevent fainting, and injuries caused by a fall. Tell your doctor if you feel dizzy, or have vision changes or ringing in the ears.  Some people get severe pain in the shoulder and have difficulty moving the arm where a shot was given. This happens very rarely.  Any medication can cause a severe allergic reaction. Such reactions from a vaccine are very rare, estimated at about 1 in a million doses, and would happen within a few minutes to a few hours after the vaccination. As with any medicine, there is a very remote chance of a vaccine causing a serious injury or death. The safety of vaccines is always being monitored. For more information, visit: http://www.aguilar.org/ 5. What if there is a serious reaction? What should I look for?  Look for anything that concerns you, such as signs of a severe allergic reaction, very high fever, or unusual behavior. Signs of a severe allergic reaction can include hives, swelling of the face and throat, difficulty breathing, a fast heartbeat, dizziness, and weakness. These would start a few minutes to a few hours after the vaccination. What should I do?  If you think it is a severe allergic reaction or other emergency that can't wait, call 9-1-1 and get the person to the  nearest hospital. Otherwise, call your doctor.  Reactions should be reported to the Vaccine Adverse Event Reporting System (VAERS). Your doctor should file this report, or you can do it yourself through the VAERS web site at www.vaers.SamedayNews.es, or by calling 807-289-6141. VAERS does not give medical advice. 6. The National Vaccine Injury Compensation Program The [employer] Vaccine Injury Compensation Program (VICP) is a federal program that was created to compensate people who may have been injured by certain vaccines. Persons who believe they may have been injured by a vaccine can learn about the program and about filing a claim by calling 501-535-9844 or visiting the [place] website at GoldCloset.com.ee.  There is a time limit to file a claim for compensation. 7. How can I learn more?  Ask your healthcare provider. He or she can give you the vaccine package insert or suggest other sources of information.  Call your local or state health department.  Contact the Centers for Disease Control and Prevention (CDC):  Call 586-271-2586 (1-800-CDC-INFO) or  Visit CDC's website at https://gibson.com/ Vaccine Information Statement Inactivated Influenza Vaccine (09/29/2013)   This information is not intended to replace advice given to you by your health care provider. Make sure you discuss any questions you have with your health care provider.   Document Released: 12/04/2005 Document Revised: 03/02/2014 Document Reviewed: 10/02/2013 Elsevier Interactive Patient Education [employer].

## 2016-04-14 ENCOUNTER — Encounter: Payer: Self-pay | Admitting: Internal Medicine

## 2016-04-27 ENCOUNTER — Ambulatory Visit (AMBULATORY_SURGERY_CENTER): Payer: Self-pay | Admitting: *Deleted

## 2016-04-27 VITALS — Ht 72.0 in | Wt 250.0 lb

## 2016-04-27 DIAGNOSIS — Z1211 Encounter for screening for malignant neoplasm of colon: Secondary | ICD-10-CM

## 2016-04-27 NOTE — Progress Notes (Signed)
No egg or soy allergy  No anesthesia or intubation problems per pt  No diet medications taken  No home oxygen used or sleep apnea

## 2016-05-01 ENCOUNTER — Ambulatory Visit (INDEPENDENT_AMBULATORY_CARE_PROVIDER_SITE_OTHER): Payer: 59

## 2016-05-01 ENCOUNTER — Encounter: Payer: Self-pay | Admitting: Family Medicine

## 2016-05-01 ENCOUNTER — Ambulatory Visit (INDEPENDENT_AMBULATORY_CARE_PROVIDER_SITE_OTHER): Payer: 59 | Admitting: Family Medicine

## 2016-05-01 VITALS — BP 144/70 | HR 94 | Temp 97.5°F | Resp 18 | Ht 72.0 in | Wt 252.0 lb

## 2016-05-01 DIAGNOSIS — Z87442 Personal history of urinary calculi: Secondary | ICD-10-CM | POA: Diagnosis not present

## 2016-05-01 DIAGNOSIS — R109 Unspecified abdominal pain: Secondary | ICD-10-CM

## 2016-05-01 DIAGNOSIS — I10 Essential (primary) hypertension: Secondary | ICD-10-CM | POA: Diagnosis not present

## 2016-05-01 DIAGNOSIS — N200 Calculus of kidney: Secondary | ICD-10-CM | POA: Diagnosis not present

## 2016-05-01 LAB — POCT URINALYSIS DIP (MANUAL ENTRY)
BILIRUBIN UA: NEGATIVE
BILIRUBIN UA: NEGATIVE
GLUCOSE UA: NEGATIVE
LEUKOCYTES UA: NEGATIVE
NITRITE UA: NEGATIVE
Spec Grav, UA: 1.02
Urobilinogen, UA: 0.2
pH, UA: 6

## 2016-05-01 LAB — POC MICROSCOPIC URINALYSIS (UMFC): Mucus: ABSENT

## 2016-05-01 MED ORDER — TAMSULOSIN HCL 0.4 MG PO CAPS: 0.4000 mg | ORAL_CAPSULE | Freq: Every day | ORAL | 3 refills | Status: DC

## 2016-05-01 MED ORDER — TRAMADOL HCL 50 MG PO TABS: 50.0000 mg | ORAL_TABLET | Freq: Four times a day (QID) | ORAL | 0 refills | Status: DC | PRN

## 2016-05-01 MED ORDER — IBUPROFEN 800 MG PO TABS: 800.0000 mg | ORAL_TABLET | Freq: Three times a day (TID) | ORAL | 0 refills | Status: DC | PRN

## 2016-05-01 NOTE — Progress Notes (Signed)
Chief Complaint  Patient presents with  . Back Pain    Pt believes he has kidney stone, has had one in the past  . Flank Pain    HPI   Pt reports that this morning at about 3am he had flank pain that woke him up from sleep. He said the pain subsided and he went to work this morning and with activity the pain has increased. Pain is described as jabbing pain only on the left side. Recently it feels like the pain is radiating into the left groin.  Denies hematuria Denies fevers or chills Passed a kidney stone 10 years ago and this episode is just like it. He has not taken any pain medication.  Pain 7/10  He denies any recent muscle injury or back pains  Past Medical History:  Diagnosis Date  . Allergy   . Chronic kidney disease    kidney stones  . Hypercholesteremia   . Hypertension     Current Outpatient Prescriptions  Medication Sig Dispense Refill  . amLODipine (NORVASC) 5 MG tablet Take 1 tablet (5 mg total) by mouth daily. 90 tablet 3  . aspirin EC 81 MG tablet Take 81 mg by mouth daily.    Marland Kitchen atorvastatin (LIPITOR) 40 MG tablet Take 1 tablet (40 mg total) by mouth daily. 90 tablet 3  . losartan (COZAAR) 100 MG tablet Take 1 tablet (100 mg total) by mouth daily. 90 tablet 3  . ibuprofen (ADVIL,MOTRIN) 800 MG tablet Take 1 tablet (800 mg total) by mouth every 8 (eight) hours as needed. 30 tablet 0  . tamsulosin (FLOMAX) 0.4 MG CAPS capsule Take 1 capsule (0.4 mg total) by mouth daily. 30 capsule 3  . traMADol (ULTRAM) 50 MG tablet Take 1 tablet (50 mg total) by mouth every 6 (six) hours as needed for severe pain. 30 tablet 0   No current facility-administered medications for this visit.     Allergies: No Known Allergies  Past Surgical History:  Procedure Laterality Date  . ANKLE SURGERY Right    no other information given  . COLONOSCOPY    . EYE SURGERY     Lasik    Social History   Social History  . Marital status: Single    Spouse name: N/A  . Number of  children: N/A  . Years of education: N/A   Occupational History  . [employer]   Social History Main Topics  . Smoking status: Never Smoker  . Smokeless tobacco: Never Used  . Alcohol use No  . Drug use: No  . Sexual activity: Yes   Other Topics Concern  . None   Social History Narrative   Married. Education: [employer].     ROS See hpi  Objective: Vitals:   05/01/16 0854  BP: (!) 144/70  Pulse: 94  Resp: 18  Temp: 97.5 F (36.4 C)  TempSrc: Oral  SpO2: 98%  Weight: 252 lb (114.3 kg)  Height: 6' (1.829 m)    Physical Exam  Constitutional: He is oriented to person, place, and time. He appears well-developed and well-nourished.  HENT:  Head: Normocephalic and atraumatic.  Eyes: Conjunctivae and EOM are normal.  Cardiovascular: Normal rate, regular rhythm and normal heart sounds.   No murmur heard. Pulmonary/Chest: Effort normal and breath sounds normal. No respiratory distress. He has no wheezes. He has no rales.  Abdominal: Soft. Bowel sounds are normal. He exhibits no distension and no mass. There is no tenderness. There is no guarding.  No flank pain or suprapubic tenderness  Neurological: He is alert and oriented to person, place, and time.   KUB shows stones on the left side but no dilated ureter  Assessment and Plan Cadan was seen today for back pain and flank pain.  Diagnoses and all orders for this visit:  Flank pain -     POCT urinalysis dipstick -     POCT Microscopic Urinalysis ([facility]) -     DG Abd 1 View No visible rash, no muscle spasms Urine shows RBCs but no bacteria on the microscopic exam and urine dipstick with large blood, -nit, -LE Pt instructed to take flomax and was given ibuprofen 800mg  with tramadol for break through Should also return to clinic or go to the ER if he does not pass urine as this could be a sign of obstruction KUB shows stones on the left side but no dilated ureter -     tamsulosin (FLOMAX) 0.4 MG CAPS  capsule; Take 1 capsule (0.4 mg total) by mouth daily. -     ibuprofen (ADVIL,MOTRIN) 800 MG tablet; Take 1 tablet (800 mg total) by mouth every 8 (eight) hours as needed. -     traMADol (ULTRAM) 50 MG tablet; Take 1 tablet (50 mg total) by mouth every 6 (six) hours as needed for severe pain.  History of kidney stones -     DG Abd 1 View  Essential hypertension Comments: Well controlled. stable on Losartan          Talmadge Ganas A Malikah Lakey

## 2016-05-01 NOTE — Patient Instructions (Addendum)
   IF you received an x-ray today, you will receive an invoice from [facility]. Please contact  Radiology at [phone] with questions or concerns regarding your invoice.   IF you received labwork today, you will receive an invoice from [facility]. Please contact [facility] at [phone] with questions or concerns regarding your invoice.   Our billing staff will not be able to assist you with questions regarding bills from these companies.  You will be contacted with the lab results as soon as they are available. The fastest way to get your results is to activate your My Chart account. Instructions are located on the last page of this paperwork. If you have not heard from us regarding the results in 2 weeks, please contact this office.     Kidney Stones Kidney stones (urolithiasis) are solid, rock-like deposits that form inside of the organs that make urine (kidneys). A kidney stone may form in a kidney and move into the bladder, where it can cause intense pain and block the flow of urine. Kidney stones are created when high levels of certain minerals are found in the urine. They are usually passed through urination, but in some cases, medical treatment may be needed to remove them. What are the causes? Kidney stones may be caused by:  A condition in which certain glands produce too much parathyroid hormone (primary hyperparathyroidism), which causes too much calcium buildup in the blood.  Buildup of uric acid crystals in the bladder (hyperuricosuria). Uric acid is a chemical that the body produces when you eat certain foods. It usually exits the body in the urine.  Narrowing (stricture) of one or both of the tubes that drain urine from the kidneys to the bladder (ureters).  A kidney blockage that is present at birth (congenital obstruction).  Past surgery on the kidney or the ureters, such as gastric bypass surgery.  What increases the risk? The following factors  make you more likely to develop kidney stones:  Having had a kidney stone in the past.  Having a family history of kidney stones.  Not drinking enough water.  Eating a diet that is high in protein, salt (sodium), or sugar.  Being overweight or obese.  What are the signs or symptoms? Symptoms of a kidney stone may include:  Nausea.  Vomiting.  Blood in the urine (hematuria).  Pain in the side of the abdomen, right below the ribs (flank pain). Pain usually spreads (radiates) to the groin.  Needing to urinate frequently or urgently.  How is this diagnosed? This condition may be diagnosed based on:  Your medical history.  A physical exam.  Blood tests.  Urine tests.  CT scan.  Abdominal X-ray.  A procedure to examine the inside of the bladder (cystoscopy).  How is this treated? Treatment for kidney stones depends on the size, location, and makeup of the stones. Treatment may involve:  Analyzing your urine before and after you pass the stone through urination.  Being monitored at the hospital until you pass the stone through urination.  Increasing your fluid intake and decreasing the amount of calcium and protein in your diet.  A procedure to break up kidney stones in the bladder using: ? A focused beam of light (laser therapy). ? Shock waves (extracorporeal shock wave lithotripsy).  Surgery to remove kidney stones. This may be needed if you have severe pain or have stones that block your urinary tract.  Follow these instructions at home: Eating and drinking   Drink   enough fluid to keep your urine clear or pale yellow. This will help you to pass the kidney stone.  If directed, change your diet. This may include: ? Limiting how much sodium you eat. ? Eating more fruits and vegetables. ? Limiting how much meat, poultry, fish, and eggs you eat.  Follow instructions from your health care provider about eating or drinking restrictions. General  instructions  Collect urine samples as told by your health care provider. You may need to collect a urine sample: ? 24 hours after you pass the stone. ? 8-12 weeks after passing the kidney stone, and every 6-12 months after that.  Strain your urine every time you urinate, for as long as directed. Use the strainer that your health care provider recommends.  Do not throw out the kidney stone after passing it. Keep the stone so it can be tested by your health care provider. Testing the makeup of your kidney stone may help prevent you from getting kidney stones in the future.  Take over-the-counter and prescription medicines only as told by your health care provider.  Keep all follow-up visits as told by your health care provider. This is important. You may need follow-up X-rays or ultrasounds to make sure that your stone has passed. How is this prevented? To prevent another kidney stone:  Drink enough fluid to keep your urine clear or pale yellow. This is the best way to prevent kidney stones.  Eat a healthy diet and follow recommendations from your health care provider about foods to avoid. You may be instructed to eat a low-protein diet. Recommendations vary depending on the type of kidney stone that you have.  Maintain a healthy weight.  Contact a health care provider if:  You have pain that gets worse or does not get better with medicine. Get help right away if:  You have a fever or chills.  You develop severe pain.  You develop new abdominal pain.  You faint.  You are unable to urinate. This information is not intended to replace advice given to you by your health care provider. Make sure you discuss any questions you have with your health care provider. Document Released: 02/09/2005 Document Revised: 08/30/2015 Document Reviewed: 07/26/2015 Elsevier Interactive Patient Education  2017 Elsevier Inc.  

## 2016-05-04 ENCOUNTER — Encounter: Payer: Self-pay | Admitting: Internal Medicine

## 2016-05-18 ENCOUNTER — Ambulatory Visit (AMBULATORY_SURGERY_CENTER): Payer: 59 | Admitting: Internal Medicine

## 2016-05-18 ENCOUNTER — Encounter: Payer: Self-pay | Admitting: Internal Medicine

## 2016-05-18 VITALS — BP 121/85 | HR 67 | Temp 99.1°F | Resp 13

## 2016-05-18 DIAGNOSIS — Z1212 Encounter for screening for malignant neoplasm of rectum: Secondary | ICD-10-CM | POA: Diagnosis not present

## 2016-05-18 DIAGNOSIS — Z1211 Encounter for screening for malignant neoplasm of colon: Secondary | ICD-10-CM | POA: Diagnosis present

## 2016-05-18 MED ORDER — SODIUM CHLORIDE 0.9 % IV SOLN: 500.0000 mL | INTRAVENOUS | Status: DC

## 2016-05-18 NOTE — Op Note (Signed)
[facility] Patient Name: Joshua [name] Procedure Date: 05/18/2016 2:38 PM MRN: [id number] Endoscopist: Gatha Mayer , MD Age: 61 Referring MD:  Date of Birth: September 01, 1955 Gender: Male Account #: [id number] Procedure:                Colonoscopy Indications:              Screening for colorectal malignant neoplasm, Last                            colonoscopy: 2005 Medicines:                Propofol per Anesthesia, Monitored Anesthesia Care Procedure:                Pre-Anesthesia Assessment:                           - Prior to the procedure, a History and Physical                            was performed, and patient medications and                            allergies were reviewed. The patient's tolerance of                            previous anesthesia was also reviewed. The risks                            and benefits of the procedure and the sedation                            options and risks were discussed with the patient.                            All questions were answered, and informed consent                            was obtained. Prior Anticoagulants: The patient                            last took aspirin 3 days and ibuprofen 3 days prior                            to the procedure. ASA Grade Assessment: II - A                            patient with mild systemic disease. After reviewing                            the risks and benefits, the patient was deemed in                            satisfactory condition to undergo the procedure.  After obtaining informed consent, the colonoscope                            was passed under direct vision. Throughout the                            procedure, the patient's blood pressure, pulse, and                            oxygen saturations were monitored continuously. The                            Colonoscope was introduced through the anus and                            advanced  to the the cecum, identified by                            appendiceal orifice and ileocecal valve. The                            colonoscopy was performed without difficulty. The                            patient tolerated the procedure well. The quality                            of the bowel preparation was excellent. The bowel                            preparation used was Miralax. The ileocecal valve,                            appendiceal orifice, and rectum were photographed. Scope In: 2:52:52 PM Scope Out: 3:02:57 PM Scope Withdrawal Time: 0 hours 7 minutes 5 seconds  Total Procedure Duration: 0 hours 10 minutes 5 seconds  Findings:                 The perianal and digital rectal examinations were                            normal. Pertinent negatives include normal prostate                            (size, shape, and consistency).                           The entire examined colon appeared normal on direct                            and retroflexion views. Complications:            No immediate complications. Estimated Blood Loss:     Estimated blood loss: none. Impression:               -  The entire examined colon is normal on direct and                            retroflexion views.                           - No specimens collected. Recommendation:           - Patient has a contact number available for                            emergencies. The signs and symptoms of potential                            delayed complications were discussed with the                            patient. Return to normal activities tomorrow.                            Written discharge instructions were provided to the                            patient.                           - Resume previous diet.                           - Continue present medications.                           - Repeat colonoscopy in 10 years for screening                            purposes. Gatha Mayer,  MD 05/18/2016 3:08:01 PM This report has been signed electronically.

## 2016-05-18 NOTE — Progress Notes (Signed)
Pt's states no medical or surgical changes since previsit or office visit. Maw

## 2016-05-18 NOTE — Progress Notes (Signed)
Report to PACU, RN, vss, BBS= Clear.  

## 2016-05-18 NOTE — Patient Instructions (Addendum)
   This exam was normal - no polyps or cancer were seen.  Next routine colonoscopy or other screening test in 10 years - 2028  I appreciate the opportunity to care for you. Gatha Mayer, MD, FACG  YOU HAD AN ENDOSCOPIC PROCEDURE TODAY AT [facility]:   Refer to the procedure report that was given to you for any specific questions about what was found during the examination.  If the procedure report does not answer your questions, please call your gastroenterologist to clarify.  If you requested that your care partner not be given the details of your procedure findings, then the procedure report has been included in a sealed envelope for you to review at your convenience later.  YOU SHOULD EXPECT: Some feelings of bloating in the abdomen. Passage of more gas than usual.  Walking can help get rid of the air that was put into your GI tract during the procedure and reduce the bloating. If you had a lower endoscopy (such as a colonoscopy or flexible sigmoidoscopy) you may notice spotting of blood in your stool or on the toilet paper. If you underwent a bowel prep for your procedure, you may not have a normal bowel movement for a few days.  Please Note:  You might notice some irritation and congestion in your nose or some drainage.  This is from the oxygen used during your procedure.  There is no need for concern and it should clear up in a day or so.  SYMPTOMS TO REPORT IMMEDIATELY:   Following lower endoscopy (colonoscopy or flexible sigmoidoscopy):  Excessive amounts of blood in the stool  Significant tenderness or worsening of abdominal pains  Swelling of the abdomen that is new, acute  Fever of 100F or higher  For urgent or emergent issues, a gastroenterologist can be reached at any hour by calling [phone].  DIET:  We do recommend a small meal at first, but then you may proceed to your regular diet.  Drink plenty of fluids but you should avoid alcoholic  beverages for 24 hours.  ACTIVITY:  You should plan to take it easy for the rest of today and you should NOT DRIVE or use heavy machinery until tomorrow (because of the sedation medicines used during the test).    FOLLOW UP: Our staff will call the number listed on your records the next business day following your procedure to check on you and address any questions or concerns that you may have regarding the information given to you following your procedure. If we do not reach you, we will leave a message.  However, if you are feeling well and you are not experiencing any problems, there is no need to return our call.  We will assume that you have returned to your regular daily activities without incident.  SIGNATURES/CONFIDENTIALITY: You and/or your care partner have signed paperwork which will be entered into your electronic medical record.  These signatures attest to the fact that that the information above on your After Visit Summary has been reviewed and is understood.  Full responsibility of the confidentiality of this discharge information lies with you and/or your care-partner.  Please continue your normal medications  Next colonoscopy-10 years

## 2016-05-19 ENCOUNTER — Telehealth: Payer: Self-pay

## 2016-05-19 NOTE — Telephone Encounter (Signed)
Nickname identifier, left voicemail we will call back later today.

## 2016-05-19 NOTE — Telephone Encounter (Signed)
  Follow up Call-  Call back number 05/18/2016  Post procedure Call Back phone  # [phone] cell  Permission to leave phone message Yes  Some recent data might be hidden    Patient was called for follow up after his procedure on 05/18/16. No answer at the number given for follow up phone call. A message was left on voice mail.

## 2016-12-14 ENCOUNTER — Other Ambulatory Visit: Payer: Self-pay | Admitting: Family Medicine

## 2016-12-14 DIAGNOSIS — I10 Essential (primary) hypertension: Secondary | ICD-10-CM

## 2016-12-15 ENCOUNTER — Other Ambulatory Visit: Payer: Self-pay | Admitting: Family Medicine

## 2016-12-15 DIAGNOSIS — E782 Mixed hyperlipidemia: Secondary | ICD-10-CM

## 2017-01-05 ENCOUNTER — Ambulatory Visit (INDEPENDENT_AMBULATORY_CARE_PROVIDER_SITE_OTHER): Payer: 59 | Admitting: Family Medicine

## 2017-01-05 ENCOUNTER — Encounter: Payer: Self-pay | Admitting: Family Medicine

## 2017-01-05 VITALS — BP 126/80 | HR 68 | Temp 98.1°F | Resp 17 | Ht 72.5 in | Wt 250.0 lb

## 2017-01-05 DIAGNOSIS — Z23 Encounter for immunization: Secondary | ICD-10-CM | POA: Diagnosis not present

## 2017-01-05 DIAGNOSIS — Z0000 Encounter for general adult medical examination without abnormal findings: Secondary | ICD-10-CM | POA: Diagnosis not present

## 2017-01-05 DIAGNOSIS — I10 Essential (primary) hypertension: Secondary | ICD-10-CM | POA: Diagnosis not present

## 2017-01-05 DIAGNOSIS — Z125 Encounter for screening for malignant neoplasm of prostate: Secondary | ICD-10-CM

## 2017-01-05 DIAGNOSIS — E782 Mixed hyperlipidemia: Secondary | ICD-10-CM | POA: Diagnosis not present

## 2017-01-05 MED ORDER — AMLODIPINE BESYLATE 5 MG PO TABS: 5.0000 mg | ORAL_TABLET | Freq: Every day | ORAL | 3 refills | Status: DC

## 2017-01-05 MED ORDER — LOSARTAN POTASSIUM 100 MG PO TABS: 100.0000 mg | ORAL_TABLET | Freq: Every day | ORAL | 3 refills | Status: DC

## 2017-01-05 MED ORDER — ATORVASTATIN CALCIUM 40 MG PO TABS: 40.0000 mg | ORAL_TABLET | Freq: Every day | ORAL | 3 refills | Status: DC

## 2017-01-05 NOTE — Patient Instructions (Addendum)
Thanks for coming in today. Increase physical activity to most days per week as we discussed, no change in medications for now. I will let you know the lab work results in the next week or two.   Keeping you healthy  Get these tests  Blood pressure- Have your blood pressure checked once a year by your healthcare provider.  Normal blood pressure is 120/80  Weight- Have your body mass index (BMI) calculated to screen for obesity.  BMI is a measure of body fat based on height and weight. You can also calculate your own BMI at ViewBanking.[email or web address].  Cholesterol- Have your cholesterol checked every year.  Diabetes- Have your blood sugar checked regularly if you have high blood pressure, high cholesterol, have a family history of diabetes or if you are overweight.  Screening for Colon Cancer- Colonoscopy starting at age 71.  Screening may begin sooner depending on your family history and other health conditions. Follow up colonoscopy as directed by your Gastroenterologist.  Screening for Prostate Cancer- Both blood work (PSA) and a rectal exam help screen for Prostate Cancer.  Screening begins at age 24 with African-American men and at age 47 with Caucasian men.  Screening may begin sooner depending on your family history.  Take these medicines  Aspirin- One aspirin daily can help prevent Heart disease and Stroke.  Flu shot- Every fall.  Tetanus- Every 10 years.  Zostavax- Once after the age of 16 to prevent Shingles.  Pneumonia shot- Once after the age of 31; if you are younger than 36, ask your healthcare provider if you need a Pneumonia shot.  Take these steps  Don't smoke- If you do smoke, talk to your doctor about quitting.  For tips on how to quit, go to www.smokefree.gov or call 1-800-QUIT-NOW.  Be physically active- Exercise 5 days a week for at least 30 minutes.  If you are not already physically active start slow and gradually work up to 30 minutes of moderate physical  activity.  Examples of moderate activity include walking briskly, mowing the yard, dancing, swimming, bicycling, etc.  Eat a healthy diet- Eat a variety of healthy food such as fruits, vegetables, low fat milk, low fat cheese, yogurt, lean meant, poultry, fish, beans, tofu, etc. For more information go to www.thenutritionsource.org  Drink alcohol in moderation- Limit alcohol intake to less than two drinks a day. Never drink and drive.  Dentist- Brush and floss twice daily; visit your dentist twice a year.  Depression- Your emotional health is as important as your physical health. If you're feeling down, or losing interest in things you would normally enjoy please talk to your healthcare provider.  Eye exam- Visit your eye doctor every year.  Safe sex- If you may be exposed to a sexually transmitted infection, use a condom.  Seat belts- Seat belts can save your life; always wear one.  Smoke/Carbon Monoxide detectors- These detectors need to be installed on the appropriate level of your home.  Replace batteries at least once a year.  Skin cancer- When out in the sun, cover up and use sunscreen 15 SPF or higher.  Violence- If anyone is threatening you, please tell your healthcare provider.  Living Will/ Health care power of attorney- Speak with your healthcare provider and family.   IF you received an x-ray today, you will receive an invoice from [facility]. Please contact [facility] Radiology at [phone] with questions or concerns regarding your invoice.   IF you received labwork today, you will receive an  invoice from [facility]. Please contact [facility] at [phone] with questions or concerns regarding your invoice.   Our billing staff will not be able to assist you with questions regarding bills from these companies.  You will be contacted with the lab results as soon as they are available. The fastest way to get your results is to activate your My Chart account.  Instructions are located on the last page of this paperwork. If you have not heard from [place] regarding the results in 2 weeks, please contact this office.

## 2017-01-05 NOTE — Progress Notes (Signed)
Subjective:  By signing my name below, I, Joshua [name], attest that this documentation has been prepared under the direction and in the presence of [name], MD Electronically Signed: Ladene Artist, ED Scribe 01/05/2017 at 11:22 AM.   Patient ID: Joshua [name], male    DOB: 05/13/1955, 61 y.o.   MRN: [id number]  Chief Complaint  Patient presents with  . Annual Exam   HPI Joshua [name] is a 61 y.o. male who presents to Primary Care at [facility] for an annual exam. H/o HTN, hyperlipidemia, ED. Last CPE with me in 11/2015.   HTN Controlled at last CPE. Continued on same dose of Norvasc 5 mg qd and Losartan 100 mg qd. Occasionally takes 81 mg aspirin. Pt checks BP at home with readings of 124/80. Denies new side-effects. Lab Results  Component Value Date   CREATININE 1.10 12/02/2015   Hyperlipidemia Lab Results  Component Value Date   CHOL 137 12/02/2015   HDL 49 12/02/2015   LDLCALC 70 12/02/2015   TRIG 88 12/02/2015   CHOLHDL 2.8 12/02/2015    Lab Results  Component Value Date   ALT 27 12/02/2015   AST 21 12/02/2015   ALKPHOS 70 12/02/2015   BILITOT 1.0 12/02/2015  Lipitor 40 mg qd. Denies new myalgias or side-effects.   CA Screening Colonoscopy: 2005 then repeated 05/18/16 Prostate CA Screening: Discussed risks and benefits. Pt agrees to DRE and PSA. Lab Results  Component Value Date   PSA 1.1 12/02/2015   PSA 1.18 11/06/2014   PSA 1.00 11/02/2013  Skin CA: has had benign areas removed. Sees dermatologist annually; his next appointment is in December   Immunizations Immunization History  Administered Date(s) Administered  . Influenza Nasal 11/19/2016  . Influenza Split 11/24/2010, 11/23/2013  . Influenza,inj,Quad PF,6+ Mos 12/02/2015  Tetanus: today; does have a grandson in [place]: has already received part 1 of Shingrix   Depression Screening Depression screen [facility] 2/9 01/05/2017 05/01/2016 12/02/2015 03/04/2015 01/29/2015  Decreased  Interest 0 0 0 0 0  Down, Depressed, Hopeless 0 0 0 0 0  PHQ - 2 Score 0 0 0 0 0     Visual Acuity Screening   Right eye Left eye Both eyes  Without correction: 20/25 20/25 20/25   With correction:      Vision: has not seen an eye doctor in a while Dentist: seen last month  Exercise/Obesity: bowling for exercise Body mass index is 33.44 kg/m. Wt Readings from Last 3 Encounters:  01/05/17 250 lb (113.4 kg)  05/01/16 252 lb (114.3 kg)  04/27/16 250 lb (113.4 kg)   Pt retired after ~31 years on 10/24/16 and recently joined a Engineer, civil (consulting).  Patient Active Problem List   Diagnosis Date Noted  . Hyperlipidemia 11/30/2013  . Morbid obesity ([place]) 11/30/2013  . DOE (dyspnea on exertion) 11/30/2013  . Other and unspecified hyperlipidemia 11/02/2013  . Stress at work 11/02/2013  . Erectile dysfunction 08/16/2012  . Hypertension 08/04/2011   Past Medical History:  Diagnosis Date  . Allergy   . Chronic kidney disease    kidney stones  . Hypercholesteremia   . Hypertension    Past Surgical History:  Procedure Laterality Date  . ANKLE SURGERY Right    no other information given  . COLONOSCOPY    . EYE SURGERY     Lasik   No Known Allergies Prior to Admission medications   Medication Sig Start Date End Date Taking? Authorizing Provider  amLODipine (NORVASC) 5 MG  tablet Take 1 tablet (5 mg total) by mouth daily. 12/02/15  Yes [name], MD  aspirin EC 81 MG tablet Take 81 mg by mouth daily.   Yes [provider]  atorvastatin (LIPITOR) 40 MG tablet Take 1 tablet (40 mg total) by mouth daily. 12/02/15  Yes [name], MD  ibuprofen (ADVIL,MOTRIN) 800 MG tablet Take 1 tablet (800 mg total) by mouth every 8 (eight) hours as needed. 05/01/16  Yes Delia Chimes A, MD  losartan (COZAAR) 100 MG tablet Take 1 tablet (100 mg total) by mouth daily. 12/02/15  Yes [name], MD  tamsulosin (FLOMAX) 0.4 MG CAPS capsule Take 1 capsule (0.4 mg total) by mouth daily.  05/01/16  Yes Forrest Moron, MD  traMADol (ULTRAM) 50 MG tablet Take 1 tablet (50 mg total) by mouth every 6 (six) hours as needed for severe pain. 05/01/16  Yes Forrest Moron, MD   Social History   Socioeconomic History  . Marital status: Single    Spouse name: Not on file  . Number of children: Not on file  . Years of education: Not on file  . Highest education level: Not on file  Social Needs  . Financial resource strain: Not on file  . Food insecurity - worry: Not on file  . Food insecurity - inability: Not on file  . Transportation needs - medical: Not on file  . Transportation needs - non-medical: Not on file  Occupational History  . Occupation: IT consultant: [employer]  Tobacco Use  . Smoking status: Never Smoker  . Smokeless tobacco: Never Used  Substance and Sexual Activity  . Alcohol use: No    Alcohol/week: 0.0 oz  . Drug use: No  . Sexual activity: Yes  Other Topics Concern  . Not on file  Social History Narrative   Married. Education: [employer].    Review of Systems  Musculoskeletal: Negative for myalgias.  All other systems reviewed and are negative. 13 point ROS was negative    Objective:   Physical Exam  Constitutional: He is oriented to person, place, and time. He appears well-developed and well-nourished.  HENT:  Head: Normocephalic and atraumatic.  Right Ear: External ear normal.  Left Ear: External ear normal.  Mouth/Throat: Oropharynx is clear and moist.  Eyes: Conjunctivae and EOM are normal. Pupils are equal, round, and reactive to light.  Neck: Normal range of motion. Neck supple. No thyromegaly present.  Cardiovascular: Normal rate, regular rhythm, normal heart sounds and intact distal pulses.  Pulmonary/Chest: Effort normal and breath sounds normal. No respiratory distress. He has no wheezes.  Abdominal: Soft. He exhibits no distension. There is no tenderness. Hernia confirmed negative in the right inguinal area and confirmed  negative in the left inguinal area.  Genitourinary: Prostate normal.  Musculoskeletal: Normal range of motion. He exhibits no edema or tenderness.  Lymphadenopathy:    He has no cervical adenopathy.  Neurological: He is alert and oriented to person, place, and time. He has normal reflexes.  Skin: Skin is warm and dry.  Psychiatric: He has a normal mood and affect. His behavior is normal.  Vitals reviewed.    Vitals:   01/05/17 1013  BP: 126/80  Pulse: 68  Resp: 17  Temp: 98.1 F (36.7 C)  TempSrc: Oral  SpO2: 98%  Weight: 250 lb (113.4 kg)  Height: 6' 0.5" (1.842 m)      Assessment & Plan:   ZANDER INGHAM is  a 61 y.o. male Annual physical exam  - -anticipatory guidance as below in AVS, screening labs above. Health maintenance items as above in HPI discussed/recommended as applicable.   Need for diphtheria-tetanus-pertussis (Tdap) vaccine - Plan: Tdap vaccine greater than or equal to 7yo IM given  Essential hypertension - Plan: losartan (COZAAR) 100 MG tablet, amLODipine (NORVASC) 5 MG tablet, Comprehensive metabolic panel  - Stable. Continue same doses of losartan and amlodipine. Labs pending  Mixed hyperlipidemia - Plan: atorvastatin (LIPITOR) 40 MG tablet, Lipid panel  -Tolerating Lipitor, check lipid panel, continue same dose for now. Did recommend increased physical activity/exercise to help manage weight and possibly help with hyperlipidemia and hypertension.  Screening for prostate cancer - Plan: PSA  - We discussed pros and cons of prostate cancer screening, and after this discussion, he chose to have screening done. PSA obtained, and no concerning findings on DRE.    Meds ordered this encounter  Medications  . losartan (COZAAR) 100 MG tablet    Sig: Take 1 tablet (100 mg total) daily by mouth.    Dispense:  90 tablet    Refill:  3  . amLODipine (NORVASC) 5 MG tablet    Sig: Take 1 tablet (5 mg total) daily by mouth.    Dispense:  90 tablet    Refill:  3   . atorvastatin (LIPITOR) 40 MG tablet    Sig: Take 1 tablet (40 mg total) daily by mouth.    Dispense:  90 tablet    Refill:  3   Patient Instructions    Thanks for coming in today. Increase physical activity to most days per week as we discussed, no change in medications for now. I will let you know the lab work results in the next week or two.   Keeping you healthy  Get these tests  Blood pressure- Have your blood pressure checked once a year by your healthcare provider.  Normal blood pressure is 120/80  Weight- Have your body mass index (BMI) calculated to screen for obesity.  BMI is a measure of body fat based on height and weight. You can also calculate your own BMI at ViewBanking.si.  Cholesterol- Have your cholesterol checked every year.  Diabetes- Have your blood sugar checked regularly if you have high blood pressure, high cholesterol, have a family history of diabetes or if you are overweight.  Screening for Colon Cancer- Colonoscopy starting at age 54.  Screening may begin sooner depending on your family history and other health conditions. Follow up colonoscopy as directed by your Gastroenterologist.  Screening for Prostate Cancer- Both blood work (PSA) and a rectal exam help screen for Prostate Cancer.  Screening begins at age 72 with African-American men and at age 26 with Caucasian men.  Screening may begin sooner depending on your family history.  Take these medicines  Aspirin- One aspirin daily can help prevent Heart disease and Stroke.  Flu shot- Every fall.  Tetanus- Every 10 years.  Zostavax- Once after the age of 58 to prevent Shingles.  Pneumonia shot- Once after the age of 93; if you are younger than 52, ask your healthcare provider if you need a Pneumonia shot.  Take these steps  Don't smoke- If you do smoke, talk to your doctor about quitting.  For tips on how to quit, go to www.smokefree.gov or call 1-800-QUIT-NOW.  Be physically active-  Exercise 5 days a week for at least 30 minutes.  If you are not already physically active start slow and  gradually work up to 30 minutes of moderate physical activity.  Examples of moderate activity include walking briskly, mowing the yard, dancing, swimming, bicycling, etc.  Eat a healthy diet- Eat a variety of healthy food such as fruits, vegetables, low fat milk, low fat cheese, yogurt, lean meant, poultry, fish, beans, tofu, etc. For more information go to www.thenutritionsource.org  Drink alcohol in moderation- Limit alcohol intake to less than two drinks a day. Never drink and drive.  Dentist- Brush and floss twice daily; visit your dentist twice a year.  Depression- Your emotional health is as important as your physical health. If you're feeling down, or losing interest in things you would normally enjoy please talk to your healthcare provider.  Eye exam- Visit your eye doctor every year.  Safe sex- If you may be exposed to a sexually transmitted infection, use a condom.  Seat belts- Seat belts can save your life; always wear one.  Smoke/Carbon Monoxide detectors- These detectors need to be installed on the appropriate level of your home.  Replace batteries at least once a year.  Skin cancer- When out in the sun, cover up and use sunscreen 15 SPF or higher.  Violence- If anyone is threatening you, please tell your healthcare provider.  Living Will/ Health care power of attorney- Speak with your healthcare provider and family.   IF you received an x-ray today, you will receive an invoice from [facility] Radiology. Please contact [facility] Radiology at [phone] with questions or concerns regarding your invoice.   IF you received labwork today, you will receive an invoice from [facility]. Please contact [facility] at [phone] with questions or concerns regarding your invoice.   Our billing staff will not be able to assist you with questions regarding bills from these  companies.  You will be contacted with the lab results as soon as they are available. The fastest way to get your results is to activate your My Chart account. Instructions are located on the last page of this paperwork. If you have not heard from [place] regarding the results in 2 weeks, please contact this office.       I personally performed the services described in this documentation, which was scribed in my presence. The recorded information has been reviewed and considered for accuracy and completeness, addended by me as needed, and agree with information above.  Signed,   Merri Ray, MD Primary Care at [facility].  01/06/17 10:13 PM

## 2017-01-06 LAB — LIPID PANEL
CHOLESTEROL TOTAL: 128 mg/dL (ref 100–199)
Chol/HDL Ratio: 2.7 ratio (ref 0.0–5.0)
HDL: 47 mg/dL (ref >39)
LDL CALC: 62 mg/dL (ref 0–99)
Triglycerides: 94 mg/dL (ref 0–149)
VLDL Cholesterol Cal: 19 mg/dL (ref 5–40)

## 2017-01-06 LAB — COMPREHENSIVE METABOLIC PANEL
A/G RATIO: 2 (ref 1.2–2.2)
ALK PHOS: 74 IU/L (ref 39–117)
ALT: 28 IU/L (ref 0–44)
AST: 19 IU/L (ref 0–40)
Albumin: 4.3 g/dL (ref 3.6–4.8)
BILIRUBIN TOTAL: 0.9 mg/dL (ref 0.0–1.2)
BUN/Creatinine Ratio: 13 (ref 10–24)
BUN: 14 mg/dL (ref 8–27)
CHLORIDE: 104 mmol/L (ref 96–106)
CO2: 21 mmol/L (ref 20–29)
Calcium: 9.1 mg/dL (ref 8.6–10.2)
Creatinine, Ser: 1.06 mg/dL (ref 0.76–1.27)
GFR calc Af Amer: 87 mL/min/{1.73_m2} (ref >59)
GFR calc non Af Amer: 75 mL/min/{1.73_m2} (ref >59)
GLOBULIN, TOTAL: 2.2 g/dL (ref 1.5–4.5)
Glucose: 97 mg/dL (ref 65–99)
POTASSIUM: 4.1 mmol/L (ref 3.5–5.2)
SODIUM: 140 mmol/L (ref 134–144)
Total Protein: 6.5 g/dL (ref 6.0–8.5)

## 2017-01-06 LAB — PSA: Prostate Specific Ag, Serum: 1.3 ng/mL (ref 0.0–4.0)

## 2017-01-19 DIAGNOSIS — Z23 Encounter for immunization: Secondary | ICD-10-CM | POA: Diagnosis not present

## 2017-02-03 ENCOUNTER — Ambulatory Visit (INDEPENDENT_AMBULATORY_CARE_PROVIDER_SITE_OTHER): Payer: 59 | Admitting: Physician Assistant

## 2017-02-03 VITALS — BP 140/90 | HR 89 | Temp 98.0°F | Resp 18 | Ht 72.5 in | Wt 254.6 lb

## 2017-02-03 DIAGNOSIS — J329 Chronic sinusitis, unspecified: Secondary | ICD-10-CM | POA: Diagnosis not present

## 2017-02-03 MED ORDER — HYDROCOD POLST-CPM POLST ER 10-8 MG/5ML PO SUER: 5.0000 mL | Freq: Two times a day (BID) | ORAL | 0 refills | Status: DC | PRN

## 2017-02-03 MED ORDER — GUAIFENESIN ER 1200 MG PO TB12: 1.0000 | ORAL_TABLET | Freq: Two times a day (BID) | ORAL | 1 refills | Status: DC | PRN

## 2017-02-03 NOTE — Patient Instructions (Addendum)
You can try delsym for the cough at daytime. Make sure you are hydrating well with 64 oz of water if not more. I would like you to use ibuprofen or tylenol for pain and fever.  For example, ibuprofen 600mg  every 6 hours with food on your belly.    Sinusitis, Adult Sinusitis is soreness and inflammation of your sinuses. Sinuses are hollow spaces in the bones around your face. They are located:  Around your eyes.  In the middle of your forehead.  Behind your nose.  In your cheekbones.  Your sinuses and nasal passages are lined with a stringy fluid (mucus). Mucus normally drains out of your sinuses. When your nasal tissues get inflamed or swollen, the mucus can get trapped or blocked so air cannot flow through your sinuses. This lets bacteria, viruses, and funguses grow, and that leads to infection. Follow these instructions at home: Medicines  Take, use, or apply over-the-counter and prescription medicines only as told by your doctor. These may include nasal sprays.  If you were prescribed an antibiotic medicine, take it as told by your doctor. Do not stop taking the antibiotic even if you start to feel better. Hydrate and Humidify  Drink enough water to keep your pee (urine) clear or pale yellow.  Use a cool mist humidifier to keep the humidity level in your home above 50%.  Breathe in steam for 10-15 minutes, 3-4 times a day or as told by your doctor. You can do this in the bathroom while a hot shower is running.  Try not to spend time in cool or dry air. Rest  Rest as much as possible.  Sleep with your head raised (elevated).  Make sure to get enough sleep each night. General instructions  Put a warm, moist washcloth on your face 3-4 times a day or as told by your doctor. This will help with discomfort.  Wash your hands often with soap and water. If there is no soap and water, use hand sanitizer.  Do not smoke. Avoid being around people who are smoking (secondhand  smoke).  Keep all follow-up visits as told by your doctor. This is important. Contact a doctor if:  You have a fever.  Your symptoms get worse.  Your symptoms do not get better within 10 days. Get help right away if:  You have a very bad headache.  You cannot stop throwing up (vomiting).  You have pain or swelling around your face or eyes.  You have trouble seeing.  You feel confused.  Your neck is stiff.  You have trouble breathing. This information is not intended to replace advice given to you by your health care provider. Make sure you discuss any questions you have with your health care provider. Document Released: 07/29/2007 Document Revised: 10/06/2015 Document Reviewed: 12/05/2014 Elsevier Interactive Patient Education  2018 [employer].    IF you received an x-ray today, you will receive an invoice from [facility]. Please contact [facility] at [phone] with questions or concerns regarding your invoice.   IF you received labwork today, you will receive an invoice from [place]. Please contact [facility] at [phone] with questions or concerns regarding your invoice.   Our billing staff will not be able to assist you with questions regarding bills from these companies.  You will be contacted with the lab results as soon as they are available. The fastest way to get your results is to activate your My Chart account. Instructions are located on the last page  of this paperwork. If you have not heard from [place] regarding the results in 2 weeks, please contact this office.

## 2017-02-03 NOTE — Progress Notes (Signed)
PRIMARY CARE AT [facility] [address], [place] [address] [phone]  Date:  02/03/2017   Name:  Joshua [name]   DOB:  05-Sep-1955   MRN:  [id number]  PCP:  [name], MD    History of Present Illness:  Joshua [name] is a 61 y.o. male patient who presents to PCP with  Chief Complaint  Patient presents with  . Cough    x3 days; yellow-green sputum  . Sinus Problem    x3 days; more sinus pressure     3 days ago, sinus drainage.  He is getting headache and pressure at the front of his head.  He has some congestion.  He is coughing up sputum this morning, which has now resolved.  No fevers.  He has some sore throat where it is draining.   He is doing corticetin which has helped some but not much.  No sick contacts.    Patient Active Problem List   Diagnosis Date Noted  . Hyperlipidemia 11/30/2013  . Morbid obesity ([place]) 11/30/2013  . DOE (dyspnea on exertion) 11/30/2013  . Other and unspecified hyperlipidemia 11/02/2013  . Stress at work 11/02/2013  . Erectile dysfunction 08/16/2012  . Hypertension 08/04/2011    Past Medical History:  Diagnosis Date  . Allergy   . Chronic kidney disease    kidney stones  . Hypercholesteremia   . Hypertension     Past Surgical History:  Procedure Laterality Date  . ANKLE SURGERY Right    no other information given  . COLONOSCOPY    . EYE SURGERY     Lasik    Social History   Tobacco Use  . Smoking status: Never Smoker  . Smokeless tobacco: Never Used  Substance Use Topics  . Alcohol use: No    Alcohol/week: 0.0 oz  . Drug use: No    Family History  Problem Relation Age of Onset  . Hypertension Mother   . Hyperlipidemia Mother   . Cancer Father   . Heart disease Father   . Hyperlipidemia Father   . Hypertension Father   . Colon cancer Neg Hx   . Esophageal cancer Neg Hx   . Rectal cancer Neg Hx   . Stomach cancer Neg Hx   . Pancreatic cancer Neg Hx   . Prostate cancer Neg Hx     No Known  Allergies  Medication list has been reviewed and updated.  Current Outpatient Medications on File Prior to Visit  Medication Sig Dispense Refill  . amLODipine (NORVASC) 5 MG tablet Take 1 tablet (5 mg total) daily by mouth. 90 tablet 3  . aspirin EC 81 MG tablet Take 81 mg by mouth daily.    Marland Kitchen atorvastatin (LIPITOR) 40 MG tablet Take 1 tablet (40 mg total) daily by mouth. 90 tablet 3  . losartan (COZAAR) 100 MG tablet Take 1 tablet (100 mg total) daily by mouth. 90 tablet 3   No current facility-administered medications on file prior to visit.     ROS ROS otherwise unremarkable unless listed above.  Physical Examination: BP 140/90   Pulse 89   Temp 98 F (36.7 C) (Oral)   Resp 18   Ht 6' 0.5" (1.842 m)   Wt 254 lb 9.6 oz (115.5 kg)   SpO2 97%   BMI 34.06 kg/m  Ideal Body Weight: Weight in (lb) to have BMI = 25: 186.5  Physical Exam  Constitutional: He is oriented to person, place, and time. He appears  well-developed and well-nourished. No distress.  HENT:  Head: Atraumatic.  Right Ear: Tympanic membrane, external ear and ear canal normal.  Left Ear: Tympanic membrane, external ear and ear canal normal.  Nose: Mucosal edema and rhinorrhea present. Right sinus exhibits no maxillary sinus tenderness and no frontal sinus tenderness. Left sinus exhibits no maxillary sinus tenderness and no frontal sinus tenderness.  Mouth/Throat: No uvula swelling. No oropharyngeal exudate, posterior oropharyngeal edema or posterior oropharyngeal erythema.  Eyes: Conjunctivae, EOM and lids are normal. Pupils are equal, round, and reactive to light. Right eye exhibits normal extraocular motion. Left eye exhibits normal extraocular motion.  Neck: Trachea normal and full passive range of motion without pain. No edema and no erythema present.  Cardiovascular: Normal rate.  Pulmonary/Chest: Effort normal. No respiratory distress. He has no decreased breath sounds. He has no wheezes. He has no rhonchi.   Neurological: He is alert and oriented to person, place, and time.  Skin: Skin is warm and dry. He is not diaphoretic.  Psychiatric: He has a normal mood and affect. His behavior is normal.    Assessment and Plan: Joshua [name] is a 61 y.o. male who is here today for cc of  Chief Complaint  Patient presents with  . Cough    x3 days; yellow-green sputum  . Sinus Problem    x3 days; more sinus pressure     If patient develops worsening facial pain, thick mucus, coughing/and, or not improved in 6 days then we can prescribe augmentin bid for 7 days. 14 tablets, 0 refill  Rhinosinusitis - Plan: Guaifenesin (MUCINEX MAXIMUM STRENGTH) 1200 MG TB12, chlorpheniramine-HYDROcodone (TUSSIONEX PENNKINETIC ER) 10-8 MG/5ML SUER  Ivar Drape, PA-C [facility] 12/15/20188:14 PM

## 2017-02-06 ENCOUNTER — Encounter: Payer: Self-pay | Admitting: Physician Assistant

## 2017-02-10 DIAGNOSIS — D1801 Hemangioma of skin and subcutaneous tissue: Secondary | ICD-10-CM | POA: Diagnosis not present

## 2017-02-10 DIAGNOSIS — L821 Other seborrheic keratosis: Secondary | ICD-10-CM | POA: Diagnosis not present

## 2017-02-10 DIAGNOSIS — D225 Melanocytic nevi of trunk: Secondary | ICD-10-CM | POA: Diagnosis not present

## 2017-04-29 ENCOUNTER — Encounter: Payer: Self-pay | Admitting: Physician Assistant

## 2017-04-29 ENCOUNTER — Ambulatory Visit (INDEPENDENT_AMBULATORY_CARE_PROVIDER_SITE_OTHER): Payer: 59 | Admitting: Physician Assistant

## 2017-04-29 ENCOUNTER — Other Ambulatory Visit: Payer: Self-pay

## 2017-04-29 VITALS — BP 136/84 | HR 83 | Temp 98.0°F | Resp 18 | Ht 73.23 in | Wt 253.2 lb

## 2017-04-29 DIAGNOSIS — J069 Acute upper respiratory infection, unspecified: Secondary | ICD-10-CM | POA: Diagnosis not present

## 2017-04-29 DIAGNOSIS — R0981 Nasal congestion: Secondary | ICD-10-CM

## 2017-04-29 DIAGNOSIS — R05 Cough: Secondary | ICD-10-CM

## 2017-04-29 DIAGNOSIS — J029 Acute pharyngitis, unspecified: Secondary | ICD-10-CM | POA: Diagnosis not present

## 2017-04-29 DIAGNOSIS — B309 Viral conjunctivitis, unspecified: Secondary | ICD-10-CM

## 2017-04-29 DIAGNOSIS — R059 Cough, unspecified: Secondary | ICD-10-CM

## 2017-04-29 MED ORDER — NAPHAZOLINE-PHENIRAMINE 0.025-0.3 % OP SOLN: 1.0000 [drp] | Freq: Four times a day (QID) | OPHTHALMIC | 0 refills | Status: DC | PRN

## 2017-04-29 MED ORDER — BENZONATATE 100 MG PO CAPS: 100.0000 mg | ORAL_CAPSULE | Freq: Three times a day (TID) | ORAL | 0 refills | Status: DC | PRN

## 2017-04-29 MED ORDER — AZELASTINE HCL 0.1 % NA SOLN: 2.0000 | Freq: Two times a day (BID) | NASAL | 0 refills | Status: DC

## 2017-04-29 MED ORDER — HYDROCODONE-HOMATROPINE 5-1.5 MG/5ML PO SYRP: 5.0000 mL | ORAL_SOLUTION | Freq: Three times a day (TID) | ORAL | 0 refills | Status: DC | PRN

## 2017-04-29 NOTE — Progress Notes (Signed)
MRN: [id number] DOB: [birth year]  Subjective:   Joshua [name] is a 62 y.o. male presenting for chief complaint of Cough (X 3 days with head congestion,watery eyes, ear congestion) .  Reports 3 days history of sinus congestion, rhinorrhea, itchy watery left eye, ear fullness, sore throat and productive cough (no hemoptysis). Has tried mucinex and nyquil with some relief. Denies fever, photophobia, acute eye injury, decreased vision, foreign body sensation,  red eye,  sinus pain, wheezing, shortness of breath and chest pain, night sweats, nausea, vomiting, abdominal pain and diarrhea. Has had sick contact with people on a cruise. Has history of seasonal allergies, no history of asthma. Patient has had flu shot this season. Denies smoking.  Denies any other aggravating or relieving factors, no other questions or concerns.  Draedyn has a current medication list which includes the following prescription(s): amlodipine, aspirin ec, atorvastatin, losartan, chlorpheniramine-hydrocodone, and guaifenesin. Also has No Known Allergies.  Gunther  has a past medical history of Allergy, Chronic kidney disease, Hypercholesteremia, and Hypertension. Also  has a past surgical history that includes Eye surgery; Ankle surgery (Right); and Colonoscopy.   Objective:   Vitals: BP 136/84 (BP Location: Left Arm, Patient Position: Sitting, Cuff Size: Large)   Pulse 83   Temp 98 F (36.7 C) (Oral)   Resp 18   Ht 6' 1.23" (1.86 m)   Wt 253 lb 3.2 oz (114.9 kg)   SpO2 95%   BMI 33.20 kg/m   Physical Exam  Constitutional: He is oriented to person, place, and time. He appears well-developed and well-nourished.  HENT:  Head: Normocephalic and atraumatic. Head is without right periorbital erythema and without left periorbital erythema.  Right Ear: Tympanic membrane, external ear and ear canal normal.  Left Ear: Tympanic membrane, external ear and ear canal normal.  Nose: Mucosal edema and rhinorrhea  present. Right sinus exhibits no maxillary sinus tenderness and no frontal sinus tenderness. Left sinus exhibits no maxillary sinus tenderness and no frontal sinus tenderness.  Mouth/Throat: Uvula is midline, oropharynx is clear and moist and mucous membranes are normal. Tonsils are 1+ on the right. Tonsils are 1+ on the left. No tonsillar exudate.  Eyes: EOM are normal. Pupils are equal, round, and reactive to light. Right eye exhibits no discharge. Left eye exhibits discharge ( watery). No foreign body present in the left eye. Right conjunctiva is not injected. Left conjunctiva is injected (mildly).  No pain with EOMs or with palpation of the periorbital region bilaterally.  Neck: Normal range of motion.  Cardiovascular: Normal rate, regular rhythm, normal heart sounds and intact distal pulses.  Pulmonary/Chest: Effort normal. He has no decreased breath sounds. He has no wheezes. He has no rhonchi. He has no rales.  Lymphadenopathy:       Head (right side): No submental, no submandibular, no tonsillar, no preauricular, no posterior auricular and no occipital adenopathy present.       Head (left side): No submental, no submandibular, no tonsillar, no preauricular, no posterior auricular and no occipital adenopathy present.    He has no cervical adenopathy.       Right: No supraclavicular adenopathy present.       Left: No supraclavicular adenopathy present.  Neurological: He is alert and oriented to person, place, and time.  Skin: Skin is warm and dry.  Psychiatric: He has a normal mood and affect.  Vitals reviewed.  No results found for this or any previous visit (from the past 24 hour(s)).  Assessment  and Plan :  1. Nasal congestion - azelastine (ASTELIN) 0.1 % nasal spray; Place 2 sprays into both nostrils 2 (two) times daily. Use in each nostril as directed  Dispense: 30 mL; Refill: 0 2. Sore throat 3. Cough - HYDROcodone-homatropine (HYCODAN) 5-1.5 MG/5ML syrup; Take 5 mLs by mouth  every 8 (eight) hours as needed for cough.  Dispense: 120 mL; Refill: 0 - benzonatate (TESSALON) 100 MG capsule; Take 1-2 capsules (100-200 mg total) by mouth 3 (three) times daily as needed for cough.  Dispense: 40 capsule; Refill: 0 4. Viral conjunctivitis - naphazoline-pheniramine (NAPHCON-A) 0.025-0.3 % ophthalmic solution; Place 1 drop into the left eye 4 (four) times daily as needed for eye irritation.  Dispense: 15 mL; Refill: 0 5. Acute upper respiratory infection History and physical exam findings consistent with acute URI.  Likely viral etiology.  Patient is overall well-appearing, no distress.  Vital stable.  He is afebrile.  Lungs CTAB.  No red flags noted on eye exam.  Will treat symptomatically at this time.  Patient given strict return/ED precautions.  Tenna Delaine, PA-C  [facility] 04/29/2017 12:14 PM

## 2017-04-29 NOTE — Patient Instructions (Addendum)
- We will treat this as a respiratory viral infection.  - I recommend you rest, drink plenty of fluids, eat light meals including soups.  - You may use cough syrup at night for your cough and sore throat, Tessalon pearls during the day to stop the cough and mucinex to help bring it up. Be aware that cough syrup can definitely make you drowsy and sleepy so do not drive or operate any heavy machinery if it is affecting you during the day.  - You may also use Tylenol or ibuprofen over-the-counter for your sore throat.  -You may use nasal spray and antihistamine for congestion. - For eye, use eye drops. Avoid rubbing eye. If anything worsens or you develop new concerning eye symptoms, please seek care immediately. - Please let me know if you are not seeing any improvement or get worse in 5-7 days.     Upper Respiratory Infection, Adult Most upper respiratory infections (URIs) are caused by a virus. A URI affects the nose, throat, and upper air passages. The most common type of URI is often called "the common cold." Follow these instructions at home:  Take medicines only as told by your doctor.  Gargle warm saltwater or take cough drops to comfort your throat as told by your doctor.  Use a warm mist humidifier or inhale steam from a shower to increase air moisture. This may make it easier to breathe.  Drink enough fluid to keep your pee (urine) clear or pale yellow.  Eat soups and other clear broths.  Have a healthy diet.  Rest as needed.  Go back to work when your fever is gone or your doctor says it is okay. ? You may need to stay home longer to avoid giving your URI to others. ? You can also wear a face mask and wash your hands often to prevent spread of the virus.  Use your inhaler more if you have asthma.  Do not use any tobacco products, including cigarettes, chewing tobacco, or electronic cigarettes. If you need help quitting, ask your doctor. Contact a doctor if:  You are  getting worse, not better.  Your symptoms are not helped by medicine.  You have chills.  You are getting more short of breath.  You have brown or red mucus.  You have yellow or brown discharge from your nose.  You have pain in your face, especially when you bend forward.  You have a fever.  You have puffy (swollen) neck glands.  You have pain while swallowing.  You have white areas in the back of your throat. Get help right away if:  You have very bad or constant: ? Headache. ? Ear pain. ? Pain in your forehead, behind your eyes, and over your cheekbones (sinus pain). ? Chest pain.  You have long-lasting (chronic) lung disease and any of the following: ? Wheezing. ? Long-lasting cough. ? Coughing up blood. ? A change in your usual mucus.  You have a stiff neck.  You have changes in your: ? Vision. ? Hearing. ? Thinking. ? Mood. This information is not intended to replace advice given to you by your health care provider. Make sure you discuss any questions you have with your health care provider. Document Released: 07/29/2007 Document Revised: 10/13/2015 Document Reviewed: 05/17/2013 Elsevier Interactive Patient Education  2018 [employer].    Viral Conjunctivitis, Adult Viral conjunctivitis is an inflammation of the clear membrane that covers the white part of your eye and the inner surface  of your eyelid (conjunctiva). The inflammation is caused by a viral infection. The blood vessels in the conjunctiva become inflamed, causing the eye to become red or pink, and often itchy. Viral conjunctivitis can be easily passed from one person to another (is contagious). This condition is often called pink eye. What are the causes? This condition is caused by a virus. A virus is a type of contagious germ. It can be spread by touching objects that have been contaminated with the virus, such as doorknobs or towels. It can also be passed through droplets, such as from coughing  or sneezing. What are the signs or symptoms? Symptoms of this condition include:  Eye redness.  Tearing or watery eyes.  Itchy and irritated eyes.  Burning feeling in the eyes.  Clear drainage from the eye.  Swollen eyelids.  A gritty feeling in the eye.  Light sensitivity.  This condition often occurs with other symptoms, such as a fever, nausea, or a rash. How is this diagnosed? This condition is diagnosed with a medical history and physical exam. If you have discharge from your eye, the discharge may be tested to rule out other causes of conjunctivitis. How is this treated? Viral conjunctivitis does not respond to medicines that kill bacteria (antibiotics). Treatment for viral conjunctivitis is directed at stopping a bacterial infection from developing in addition to the viral infection. Treatment also aims to relieve your symptoms, such as itching. This may be done with antihistamine drops or other eye medicines. Rarely, steroid eye drops or antiviral medicines may be prescribed. Follow these instructions at home: Medicines   Take or apply over-the-counter and prescription medicines only as told by your health care provider.  Be very careful to avoid touching the edge of the eyelid with the eye drop bottle or ointment tube when applying medicines to the affected eye. Being careful this way will stop you from spreading the infection to the other eye or to other people. Eye care  Avoid touching or rubbing your eyes.  Apply a warm, wet, clean washcloth to your eye for 10-20 minutes, 3-4 times per day or as told by your health care provider.  If you wear contact lenses, do not wear them until the inflammation is gone and your health care provider says it is safe to wear them again. Ask your health care provider how to sterilize or replace your contact lenses before using them again. Wear glasses until you can resume wearing contacts.  Avoid wearing eye makeup until the  inflammation is gone. Throw away any old eye cosmetics that may be contaminated.  Gently wipe away any drainage from your eye with a warm, wet washcloth or a cotton ball. General instructions  Change or wash your pillowcase every day or as told by your health care provider.  Do not share towels, pillowcases, washcloths, eye makeup, makeup brushes, contact lenses, or glasses. This may spread the infection.  Wash your hands often with soap and water. Use paper towels to dry your hands. If soap and water are not available, use hand sanitizer.  Try to avoid contact with other people for one week or as told by your health care provider. Contact a health care provider if:  Your symptoms do not improve with treatment or they get worse.  You have increased pain.  Your vision becomes blurry.  You have a fever.  You have facial pain, redness, or swelling.  You have yellow or green drainage coming from your eye.  You have  new symptoms. This information is not intended to replace advice given to you by your health care provider. Make sure you discuss any questions you have with your health care provider. Document Released: 05/02/2002 Document Revised: 09/07/2015 Document Reviewed: 08/27/2015 Elsevier Interactive Patient Education  2018 [employer].  IF you received an x-ray today, you will receive an invoice from [facility]. Please contact [facility] at [phone] with questions or concerns regarding your invoice.   IF you received labwork today, you will receive an invoice from [place]. Please contact [facility] at [phone] with questions or concerns regarding your invoice.   Our billing staff will not be able to assist you with questions regarding bills from these companies.  You will be contacted with the lab results as soon as they are available. The fastest way to get your results is to activate your My Chart account. Instructions are located on the last page  of this paperwork. If you have not heard from [place] regarding the results in 2 weeks, please contact this office.

## 2017-05-26 ENCOUNTER — Other Ambulatory Visit: Payer: Self-pay | Admitting: Physician Assistant

## 2017-05-26 DIAGNOSIS — R0981 Nasal congestion: Secondary | ICD-10-CM

## 2017-06-02 ENCOUNTER — Encounter: Payer: Self-pay | Admitting: Physician Assistant

## 2017-11-04 ENCOUNTER — Encounter: Payer: Self-pay | Admitting: Family Medicine

## 2017-11-04 ENCOUNTER — Ambulatory Visit (INDEPENDENT_AMBULATORY_CARE_PROVIDER_SITE_OTHER): Payer: 59 | Admitting: Family Medicine

## 2017-11-04 VITALS — BP 158/82 | HR 70 | Temp 98.1°F | Ht 72.0 in | Wt 252.0 lb

## 2017-11-04 DIAGNOSIS — E782 Mixed hyperlipidemia: Secondary | ICD-10-CM

## 2017-11-04 DIAGNOSIS — I10 Essential (primary) hypertension: Secondary | ICD-10-CM | POA: Diagnosis not present

## 2017-11-04 DIAGNOSIS — R51 Headache: Secondary | ICD-10-CM | POA: Diagnosis not present

## 2017-11-04 DIAGNOSIS — R519 Headache, unspecified: Secondary | ICD-10-CM

## 2017-11-04 LAB — COMPREHENSIVE METABOLIC PANEL
A/G RATIO: 2 (ref 1.2–2.2)
ALT: 23 IU/L (ref 0–44)
AST: 17 IU/L (ref 0–40)
Albumin: 4.6 g/dL (ref 3.6–4.8)
Alkaline Phosphatase: 77 IU/L (ref 39–117)
BILIRUBIN TOTAL: 0.6 mg/dL (ref 0.0–1.2)
BUN/Creatinine Ratio: 14 (ref 10–24)
BUN: 15 mg/dL (ref 8–27)
CHLORIDE: 103 mmol/L (ref 96–106)
CO2: 22 mmol/L (ref 20–29)
Calcium: 9.6 mg/dL (ref 8.6–10.2)
Creatinine, Ser: 1.05 mg/dL (ref 0.76–1.27)
GFR calc Af Amer: 87 mL/min/{1.73_m2} (ref >59)
GFR calc non Af Amer: 75 mL/min/{1.73_m2} (ref >59)
GLUCOSE: 95 mg/dL (ref 65–99)
Globulin, Total: 2.3 g/dL (ref 1.5–4.5)
POTASSIUM: 4.3 mmol/L (ref 3.5–5.2)
Sodium: 140 mmol/L (ref 134–144)
TOTAL PROTEIN: 6.9 g/dL (ref 6.0–8.5)

## 2017-11-04 LAB — LIPID PANEL
CHOL/HDL RATIO: 2.7 ratio (ref 0.0–5.0)
Cholesterol, Total: 137 mg/dL (ref 100–199)
HDL: 51 mg/dL (ref >39)
LDL CALC: 71 mg/dL (ref 0–99)
TRIGLYCERIDES: 74 mg/dL (ref 0–149)
VLDL CHOLESTEROL CAL: 15 mg/dL (ref 5–40)

## 2017-11-04 MED ORDER — ATORVASTATIN CALCIUM 40 MG PO TABS: 20.0000 mg | ORAL_TABLET | Freq: Every day | ORAL | 1 refills | Status: DC

## 2017-11-04 MED ORDER — AMLODIPINE BESYLATE 10 MG PO TABS: 10.0000 mg | ORAL_TABLET | Freq: Every day | ORAL | 1 refills | Status: DC

## 2017-11-04 MED ORDER — ATORVASTATIN CALCIUM 40 MG PO TABS: 40.0000 mg | ORAL_TABLET | Freq: Every day | ORAL | 1 refills | Status: DC

## 2017-11-04 NOTE — Progress Notes (Signed)
Subjective:  By signing my name below, I, [name], attest that this documentation has been prepared under the direction and in the presence of [name], MD Electronically Signed: Ladene Artist, ED Scribe 11/04/2017 at 9:53 AM.   Patient ID: Joshua [name], male    DOB: [birth year], 62 y.o.   MRN: [id number]  Chief Complaint  Patient presents with  . medication concern    Losartin/ Cozaar  . Hypertension    getting headaches due to increased BP few months.    HPI Joshua [name] is a 62 y.o. male who presents to Primary Care at [facility] for f/u on HTN. Last seen 12/2016 for CPE. Seen for acute issues in March. Was advised in 12/2016 for 6 month BP and cholesterol f/u. Pt is fasting at this OV.  HTN Lab Results  Component Value Date   CREATININE 1.06 01/05/2017   BP Readings from Last 3 Encounters:  11/04/17 (!) 158/82  04/29/17 136/84  02/03/17 140/90  Norvasc 5 mg qd, losartan 100 mg qd. - Pt reports home BP readings of 150/85 x 1 month. States his BP med was recalled and noticed that his BP has been creeping up since switching to a new manufacturer. Reports some HAs with elevated BP readings. Reports rare alcohol use and only adds salt to his french fries. Denies focal weakness, weakness in extremities, slurred speech, leg swelling, visual disturbances, missed doses.  Hyperlipidemia Lab Results  Component Value Date   CHOL 128 01/05/2017   HDL 47 01/05/2017   LDLCALC 62 01/05/2017   TRIG 94 01/05/2017   CHOLHDL 2.7 01/05/2017   Lab Results  Component Value Date   ALT 28 01/05/2017   AST 19 01/05/2017   ALKPHOS 74 01/05/2017   BILITOT 0.9 01/05/2017  Lipitor 20 mg qd. - Denies new myalgias, arthralgias, side-effects.  H/o Obesity Wt Readings from Last 3 Encounters:  11/04/17 252 lb (114.3 kg)  04/29/17 253 lb 3.2 oz (114.9 kg)  02/03/17 254 lb 9.6 oz (115.5 kg)    Patient Active Problem List   Diagnosis Date Noted  . Hyperlipidemia 11/30/2013    . Morbid obesity ([place]) 11/30/2013  . DOE (dyspnea on exertion) 11/30/2013  . Other and unspecified hyperlipidemia 11/02/2013  . Stress at work 11/02/2013  . Erectile dysfunction 08/16/2012  . Hypertension 08/04/2011   Past Medical History:  Diagnosis Date  . Allergy   . Chronic kidney disease    kidney stones  . Hypercholesteremia   . Hypertension    Past Surgical History:  Procedure Laterality Date  . ANKLE SURGERY Right    no other information given  . COLONOSCOPY    . EYE SURGERY     Lasik   No Known Allergies Prior to Admission medications   Medication Sig Start Date End Date Taking? Authorizing Provider  amLODipine (NORVASC) 5 MG tablet Take 1 tablet (5 mg total) daily by mouth. 01/05/17   [name], MD  aspirin EC 81 MG tablet Take 81 mg by mouth daily.    [provider]  atorvastatin (LIPITOR) 40 MG tablet Take 1 tablet (40 mg total) daily by mouth. 01/05/17   [name], MD  azelastine (ASTELIN) 0.1 % nasal spray PLACE 2 SPRAYS INTO BOTH NOSTRILS 2 (TWO) TIMES DAILY AS DIRECTED 05/26/17   [name], PA-C  benzonatate (TESSALON) 100 MG capsule Take 1-2 capsules (100-200 mg total) by mouth 3 (three) times daily as needed for cough. 04/29/17   [name],  [place] D, PA-C  chlorpheniramine-HYDROcodone (TUSSIONEX PENNKINETIC ER) 10-8 MG/5ML SUER Take 5 mLs by mouth every 12 (twelve) hours as needed. Patient not taking: Reported on 04/29/2017 02/03/17   Ivar Drape D, PA  Guaifenesin [facility] MAXIMUM STRENGTH) 1200 MG TB12 Take 1 tablet (1,200 mg total) by mouth every 12 (twelve) hours as needed. Patient not taking: Reported on 04/29/2017 02/03/17   Ivar Drape D, PA  HYDROcodone-homatropine [facility]) 5-1.5 MG/5ML syrup Take 5 mLs by mouth every 8 (eight) hours as needed for cough. 04/29/17   [name], PA-C  losartan (COZAAR) 100 MG tablet Take 1 tablet (100 mg total) daily by mouth. 01/05/17   [name], MD   naphazoline-pheniramine (NAPHCON-A) 0.025-0.3 % ophthalmic solution Place 1 drop into the left eye 4 (four) times daily as needed for eye irritation. 04/29/17   [name], PA-C   Social History   Socioeconomic History  . Marital status: Married    Spouse name: Not on file  . Number of children: 0  . Years of education: Not on file  . Highest education level: Not on file  Occupational History  . Occupation: IT consultant: [employer]  . Financial resource strain: Not on file  . Food insecurity:    Worry: Not on file    Inability: Not on file  . Transportation needs:    Medical: Not on file    Non-medical: Not on file  Tobacco Use  . Smoking status: Never Smoker  . Smokeless tobacco: Never Used  Substance and Sexual Activity  . Alcohol use: Yes    Alcohol/week: 0.0 standard drinks    Comment: occ  . Drug use: No  . Sexual activity: Yes  Lifestyle  . Physical activity:    Days per week: Not on file    Minutes per session: Not on file  . Stress: Not on file  Relationships  . Social connections:    Talks on phone: Not on file    Gets together: Not on file    Attends religious service: Not on file    Active member of club or organization: Not on file    Attends meetings of clubs or organizations: Not on file    Relationship status: Not on file  . Intimate partner violence:    Fear of current or ex partner: Not on file    Emotionally abused: Not on file    Physically abused: Not on file    Forced sexual activity: Not on file  Other Topics Concern  . Not on file  Social History Narrative   Married. Education: [employer].    Review of Systems  Constitutional: Negative for fatigue and unexpected weight change.  Eyes: Negative for visual disturbance.  Respiratory: Negative for cough, chest tightness and shortness of breath.   Cardiovascular: Negative for chest pain, palpitations and leg swelling.  Gastrointestinal: Negative for abdominal  pain and blood in stool.  Musculoskeletal: Negative for arthralgias and myalgias.  Neurological: Positive for headaches. Negative for dizziness, speech difficulty, weakness and light-headedness.      Objective:   Physical Exam  Constitutional: He is oriented to person, place, and time. He appears well-developed and well-nourished.  HENT:  Head: Normocephalic and atraumatic.  Eyes: Pupils are equal, round, and reactive to light. EOM are normal.  Neck: No JVD present. Carotid bruit is not present.  Cardiovascular: Normal rate, regular rhythm and normal heart sounds.  No murmur heard. Pulmonary/Chest: Effort normal  and breath sounds normal. He has no rales.  Musculoskeletal: He exhibits edema.  Very trace ankle edema.  Neurological: He is alert and oriented to person, place, and time.  Skin: Skin is warm and dry.  Psychiatric: He has a normal mood and affect.  Vitals reviewed.  Vitals:   11/04/17 0927 11/04/17 0930  BP: (!) 158/93 (!) 158/82  Pulse: 70   Temp: 98.1 F (36.7 C)   TempSrc: Oral   SpO2: 98%   Weight: 252 lb (114.3 kg)   Height: 6' (1.829 m)       Assessment & Plan:    KAITO SCHULENBURG is a 62 y.o. male Nonintractable episodic headache, unspecified headache type Essential hypertension - Plan: Comprehensive metabolic panel, amLODipine (NORVASC) 10 MG tablet  -Suspect headaches are due to elevated blood pressure.  Nonfocal exam, no concerning symptoms on history.  -Increase amlodipine to 10 mg daily, continue losartan 100 mg daily, check labs.  Potential side effects of amlodipine and possibility of increased peripheral edema discussed.  RTC precautions.  -RTC precautions if headaches persist at lower blood pressure reading.  Handout given hypertension and avoid excess sodium in the diet  -Recheck within the next 3 months for physical.  Mixed hyperlipidemia - Plan: Lipid panel, Comprehensive metabolic panel, atorvastatin (LIPITOR) 40 MG tablet, DISCONTINUED:  atorvastatin (LIPITOR) 40 MG tablet  -Tolerating current dose of atorvastatin at 20 mg total per day.  Check labs, recheck 3 months for physical.  Meds ordered this encounter  Medications  . amLODipine (NORVASC) 10 MG tablet    Sig: Take 1 tablet (10 mg total) by mouth daily.    Dispense:  90 tablet    Refill:  1  . DISCONTD: atorvastatin (LIPITOR) 40 MG tablet    Sig: Take 1 tablet (40 mg total) by mouth daily.    Dispense:  90 tablet    Refill:  1  . atorvastatin (LIPITOR) 40 MG tablet    Sig: Take 0.5 tablets (20 mg total) by mouth daily.    Dispense:  90 tablet    Refill:  1   Patient Instructions   Try higher dose of amlodipine at 10 mg/day, continue losartan same dose as well as the atorvastatin.  If headaches are not improving with higher dose of amlodipine, please follow-up to discuss further.  Additionally see information below on hypertension.  Watch sodium in the diet.  Please follow-up in the next 3 months for physical.  Thank you for coming in today.    Hypertension Hypertension, commonly called high blood pressure, is when the force of blood pumping through the arteries is too strong. The arteries are the blood vessels that carry blood from the heart throughout the body. Hypertension forces the heart to work harder to pump blood and may cause arteries to become narrow or stiff. Having untreated or uncontrolled hypertension can cause heart attacks, strokes, kidney disease, and other problems. A blood pressure reading consists of a higher number over a lower number. Ideally, your blood pressure should be below 120/80. The first ("top") number is called the systolic pressure. It is a measure of the pressure in your arteries as your heart beats. The second ("bottom") number is called the diastolic pressure. It is a measure of the pressure in your arteries as the heart relaxes. What are the causes? The cause of this condition is not known. What increases the risk? Some risk  factors for high blood pressure are under your control. Others are not. Factors you  can change  Smoking.  Having type 2 diabetes mellitus, high cholesterol, or both.  Not getting enough exercise or physical activity.  Being overweight.  Having too much fat, sugar, calories, or salt (sodium) in your diet.  Drinking too much alcohol. Factors that are difficult or impossible to change  Having chronic kidney disease.  Having a family history of high blood pressure.  Age. Risk increases with age.  Race. You may be at higher risk if you are African-American.  Gender. Men are at higher risk than women before age 50. After age 40, women are at higher risk than men.  Having obstructive sleep apnea.  Stress. What are the signs or symptoms? Extremely high blood pressure (hypertensive crisis) may cause:  Headache.  Anxiety.  Shortness of breath.  Nosebleed.  Nausea and vomiting.  Severe chest pain.  Jerky movements you cannot control (seizures).  How is this diagnosed? This condition is diagnosed by measuring your blood pressure while you are seated, with your arm resting on a surface. The cuff of the blood pressure monitor will be placed directly against the skin of your upper arm at the level of your heart. It should be measured at least twice using the same arm. Certain conditions can cause a difference in blood pressure between your right and left arms. Certain factors can cause blood pressure readings to be lower or higher than normal (elevated) for a short period of time:  When your blood pressure is higher when you are in a health care provider's office than when you are at home, this is called white coat hypertension. Most people with this condition do not need medicines.  When your blood pressure is higher at home than when you are in a health care provider's office, this is called masked hypertension. Most people with this condition may need medicines to control  blood pressure.  If you have a high blood pressure reading during one visit or you have normal blood pressure with other risk factors:  You may be asked to return on a different day to have your blood pressure checked again.  You may be asked to monitor your blood pressure at home for 1 week or longer.  If you are diagnosed with hypertension, you may have other blood or imaging tests to help your health care provider understand your overall risk for other conditions. How is this treated? This condition is treated by making healthy lifestyle changes, such as eating healthy foods, exercising more, and reducing your alcohol intake. Your health care provider may prescribe medicine if lifestyle changes are not enough to get your blood pressure under control, and if:  Your systolic blood pressure is above 130.  Your diastolic blood pressure is above 80.  Your personal target blood pressure may vary depending on your medical conditions, your age, and other factors. Follow these instructions at home: Eating and drinking  Eat a diet that is high in fiber and potassium, and low in sodium, added sugar, and fat. An example eating plan is called the DASH (Dietary Approaches to Stop Hypertension) diet. To eat this way: ? Eat plenty of fresh fruits and vegetables. Try to fill half of your plate at each meal with fruits and vegetables. ? Eat whole grains, such as whole wheat pasta, brown rice, or whole grain bread. Fill about one quarter of your plate with whole grains. ? Eat or drink low-fat dairy products, such as skim milk or low-fat yogurt. ? Avoid fatty cuts of meat, processed  or cured meats, and poultry with skin. Fill about one quarter of your plate with lean proteins, such as fish, chicken without skin, beans, eggs, and tofu. ? Avoid premade and processed foods. These tend to be higher in sodium, added sugar, and fat.  Reduce your daily sodium intake. Most people with hypertension should eat less  than 1,500 mg of sodium a day.  Limit alcohol intake to no more than 1 drink a day for nonpregnant women and 2 drinks a day for men. One drink equals 12 oz of beer, 5 oz of wine, or 1 oz of hard liquor. Lifestyle  Work with your health care provider to maintain a healthy body weight or to lose weight. Ask what an ideal weight is for you.  Get at least 30 minutes of exercise that causes your heart to beat faster (aerobic exercise) most days of the week. Activities may include walking, swimming, or biking.  Include exercise to strengthen your muscles (resistance exercise), such as pilates or lifting weights, as part of your weekly exercise routine. Try to do these types of exercises for 30 minutes at least 3 days a week.  Do not use any products that contain nicotine or tobacco, such as cigarettes and e-cigarettes. If you need help quitting, ask your health care provider.  Monitor your blood pressure at home as told by your health care provider.  Keep all follow-up visits as told by your health care provider. This is important. Medicines  Take over-the-counter and prescription medicines only as told by your health care provider. Follow directions carefully. Blood pressure medicines must be taken as prescribed.  Do not skip doses of blood pressure medicine. Doing this puts you at risk for problems and can make the medicine less effective.  Ask your health care provider about side effects or reactions to medicines that you should watch for. Contact a health care provider if:  You think you are having a reaction to a medicine you are taking.  You have headaches that keep coming back (recurring).  You feel dizzy.  You have swelling in your ankles.  You have trouble with your vision. Get help right away if:  You develop a severe headache or confusion.  You have unusual weakness or numbness.  You feel faint.  You have severe pain in your chest or abdomen.  You vomit  repeatedly.  You have trouble breathing. Summary  Hypertension is when the force of blood pumping through your arteries is too strong. If this condition is not controlled, it may put you at risk for serious complications.  Your personal target blood pressure may vary depending on your medical conditions, your age, and other factors. For most people, a normal blood pressure is less than 120/80.  Hypertension is treated with lifestyle changes, medicines, or a combination of both. Lifestyle changes include weight loss, eating a healthy, low-sodium diet, exercising more, and limiting alcohol. This information is not intended to replace advice given to you by your health care provider. Make sure you discuss any questions you have with your health care provider. Document Released: 02/09/2005 Document Revised: 01/08/2016 Document Reviewed: 01/08/2016 Elsevier Interactive Patient Education  [employer].    If you have lab work done today you will be contacted with your lab results within the next 2 weeks.  If you have not heard from [place] then please contact us. The fastest way to get your results is to register for My Chart.   IF you received an x-ray  today, you will receive an invoice from [facility]. Please contact [facility] at [phone] with questions or concerns regarding your invoice.   IF you received labwork today, you will receive an invoice from [place]. Please contact [facility] at [phone] with questions or concerns regarding your invoice.   Our billing staff will not be able to assist you with questions regarding bills from these companies.  You will be contacted with the lab results as soon as they are available. The fastest way to get your results is to activate your My Chart account. Instructions are located on the last page of this paperwork. If you have not heard from [place] regarding the results in 2 weeks, please contact this office.       I  personally performed the services described in this documentation, which was scribed in my presence. The recorded information has been reviewed and considered for accuracy and completeness, addended by me as needed, and agree with information above.  Signed,   Merri Ray, MD [facility].  11/04/17 10:06 AM

## 2017-11-04 NOTE — Patient Instructions (Addendum)
Try higher dose of amlodipine at 10 mg/day, continue losartan same dose as well as the atorvastatin.  If headaches are not improving with higher dose of amlodipine, please follow-up to discuss further.  Additionally see information below on hypertension.  Watch sodium in the diet.  Please follow-up in the next 3 months for physical.  Thank you for coming in today.    Hypertension Hypertension, commonly called high blood pressure, is when the force of blood pumping through the arteries is too strong. The arteries are the blood vessels that carry blood from the heart throughout the body. Hypertension forces the heart to work harder to pump blood and may cause arteries to become narrow or stiff. Having untreated or uncontrolled hypertension can cause heart attacks, strokes, kidney disease, and other problems. A blood pressure reading consists of a higher number over a lower number. Ideally, your blood pressure should be below 120/80. The first ("top") number is called the systolic pressure. It is a measure of the pressure in your arteries as your heart beats. The second ("bottom") number is called the diastolic pressure. It is a measure of the pressure in your arteries as the heart relaxes. What are the causes? The cause of this condition is not known. What increases the risk? Some risk factors for high blood pressure are under your control. Others are not. Factors you can change  Smoking.  Having type 2 diabetes mellitus, high cholesterol, or both.  Not getting enough exercise or physical activity.  Being overweight.  Having too much fat, sugar, calories, or salt (sodium) in your diet.  Drinking too much alcohol. Factors that are difficult or impossible to change  Having chronic kidney disease.  Having a family history of high blood pressure.  Age. Risk increases with age.  Race. You may be at higher risk if you are African-American.  Gender. Men are at higher risk than women before  age 82. After age 14, women are at higher risk than men.  Having obstructive sleep apnea.  Stress. What are the signs or symptoms? Extremely high blood pressure (hypertensive crisis) may cause:  Headache.  Anxiety.  Shortness of breath.  Nosebleed.  Nausea and vomiting.  Severe chest pain.  Jerky movements you cannot control (seizures).  How is this diagnosed? This condition is diagnosed by measuring your blood pressure while you are seated, with your arm resting on a surface. The cuff of the blood pressure monitor will be placed directly against the skin of your upper arm at the level of your heart. It should be measured at least twice using the same arm. Certain conditions can cause a difference in blood pressure between your right and left arms. Certain factors can cause blood pressure readings to be lower or higher than normal (elevated) for a short period of time:  When your blood pressure is higher when you are in a health care provider's office than when you are at home, this is called white coat hypertension. Most people with this condition do not need medicines.  When your blood pressure is higher at home than when you are in a health care provider's office, this is called masked hypertension. Most people with this condition may need medicines to control blood pressure.  If you have a high blood pressure reading during one visit or you have normal blood pressure with other risk factors:  You may be asked to return on a different day to have your blood pressure checked again.  You may be asked  to monitor your blood pressure at home for 1 week or longer.  If you are diagnosed with hypertension, you may have other blood or imaging tests to help your health care provider understand your overall risk for other conditions. How is this treated? This condition is treated by making healthy lifestyle changes, such as eating healthy foods, exercising more, and reducing your  alcohol intake. Your health care provider may prescribe medicine if lifestyle changes are not enough to get your blood pressure under control, and if:  Your systolic blood pressure is above 130.  Your diastolic blood pressure is above 80.  Your personal target blood pressure may vary depending on your medical conditions, your age, and other factors. Follow these instructions at home: Eating and drinking  Eat a diet that is high in fiber and potassium, and low in sodium, added sugar, and fat. An example eating plan is called the DASH (Dietary Approaches to Stop Hypertension) diet. To eat this way: ? Eat plenty of fresh fruits and vegetables. Try to fill half of your plate at each meal with fruits and vegetables. ? Eat whole grains, such as whole wheat pasta, brown rice, or whole grain bread. Fill about one quarter of your plate with whole grains. ? Eat or drink low-fat dairy products, such as skim milk or low-fat yogurt. ? Avoid fatty cuts of meat, processed or cured meats, and poultry with skin. Fill about one quarter of your plate with lean proteins, such as fish, chicken without skin, beans, eggs, and tofu. ? Avoid premade and processed foods. These tend to be higher in sodium, added sugar, and fat.  Reduce your daily sodium intake. Most people with hypertension should eat less than 1,500 mg of sodium a day.  Limit alcohol intake to no more than 1 drink a day for nonpregnant women and 2 drinks a day for men. One drink equals 12 oz of beer, 5 oz of wine, or 1 oz of hard liquor. Lifestyle  Work with your health care provider to maintain a healthy body weight or to lose weight. Ask what an ideal weight is for you.  Get at least 30 minutes of exercise that causes your heart to beat faster (aerobic exercise) most days of the week. Activities may include walking, swimming, or biking.  Include exercise to strengthen your muscles (resistance exercise), such as pilates or lifting weights, as part  of your weekly exercise routine. Try to do these types of exercises for 30 minutes at least 3 days a week.  Do not use any products that contain nicotine or tobacco, such as cigarettes and e-cigarettes. If you need help quitting, ask your health care provider.  Monitor your blood pressure at home as told by your health care provider.  Keep all follow-up visits as told by your health care provider. This is important. Medicines  Take over-the-counter and prescription medicines only as told by your health care provider. Follow directions carefully. Blood pressure medicines must be taken as prescribed.  Do not skip doses of blood pressure medicine. Doing this puts you at risk for problems and can make the medicine less effective.  Ask your health care provider about side effects or reactions to medicines that you should watch for. Contact a health care provider if:  You think you are having a reaction to a medicine you are taking.  You have headaches that keep coming back (recurring).  You feel dizzy.  You have swelling in your ankles.  You have trouble with  your vision. Get help right away if:  You develop a severe headache or confusion.  You have unusual weakness or numbness.  You feel faint.  You have severe pain in your chest or abdomen.  You vomit repeatedly.  You have trouble breathing. Summary  Hypertension is when the force of blood pumping through your arteries is too strong. If this condition is not controlled, it may put you at risk for serious complications.  Your personal target blood pressure may vary depending on your medical conditions, your age, and other factors. For most people, a normal blood pressure is less than 120/80.  Hypertension is treated with lifestyle changes, medicines, or a combination of both. Lifestyle changes include weight loss, eating a healthy, low-sodium diet, exercising more, and limiting alcohol. This information is not intended to  replace advice given to you by your health care provider. Make sure you discuss any questions you have with your health care provider. Document Released: 02/09/2005 Document Revised: 01/08/2016 Document Reviewed: 01/08/2016 Elsevier Interactive Patient Education  [employer].    If you have lab work done today you will be contacted with your lab results within the next 2 weeks.  If you have not heard from [place] then please contact us. The fastest way to get your results is to register for My Chart.   IF you received an x-ray today, you will receive an invoice from [facility]. Please contact [facility] at [phone] with questions or concerns regarding your invoice.   IF you received labwork today, you will receive an invoice from [facility]. Please contact [facility] at [phone] with questions or concerns regarding your invoice.   Our billing staff will not be able to assist you with questions regarding bills from these companies.  You will be contacted with the lab results as soon as they are available. The fastest way to get your results is to activate your My Chart account. Instructions are located on the last page of this paperwork. If you have not heard from [place] regarding the results in 2 weeks, please contact this office.

## 2017-12-08 DIAGNOSIS — Z23 Encounter for immunization: Secondary | ICD-10-CM | POA: Diagnosis not present

## 2017-12-30 ENCOUNTER — Encounter: Payer: Self-pay | Admitting: Family Medicine

## 2017-12-30 DIAGNOSIS — D1801 Hemangioma of skin and subcutaneous tissue: Secondary | ICD-10-CM | POA: Diagnosis not present

## 2017-12-30 DIAGNOSIS — L814 Other melanin hyperpigmentation: Secondary | ICD-10-CM | POA: Diagnosis not present

## 2017-12-30 DIAGNOSIS — D225 Melanocytic nevi of trunk: Secondary | ICD-10-CM | POA: Diagnosis not present

## 2017-12-30 DIAGNOSIS — C44311 Basal cell carcinoma of skin of nose: Secondary | ICD-10-CM | POA: Diagnosis not present

## 2018-01-14 ENCOUNTER — Other Ambulatory Visit: Payer: Self-pay | Admitting: Family Medicine

## 2018-01-14 DIAGNOSIS — I10 Essential (primary) hypertension: Secondary | ICD-10-CM

## 2018-02-08 ENCOUNTER — Ambulatory Visit (INDEPENDENT_AMBULATORY_CARE_PROVIDER_SITE_OTHER): Payer: 59 | Admitting: Family Medicine

## 2018-02-08 ENCOUNTER — Encounter: Payer: Self-pay | Admitting: Family Medicine

## 2018-02-08 VITALS — BP 131/84 | HR 79 | Temp 98.5°F | Ht 72.0 in | Wt 249.6 lb

## 2018-02-08 DIAGNOSIS — I10 Essential (primary) hypertension: Secondary | ICD-10-CM | POA: Diagnosis not present

## 2018-02-08 DIAGNOSIS — Z0000 Encounter for general adult medical examination without abnormal findings: Secondary | ICD-10-CM

## 2018-02-08 DIAGNOSIS — Z0001 Encounter for general adult medical examination with abnormal findings: Secondary | ICD-10-CM

## 2018-02-08 DIAGNOSIS — Z125 Encounter for screening for malignant neoplasm of prostate: Secondary | ICD-10-CM

## 2018-02-08 DIAGNOSIS — E782 Mixed hyperlipidemia: Secondary | ICD-10-CM

## 2018-02-08 MED ORDER — LOSARTAN POTASSIUM 100 MG PO TABS: 100.0000 mg | ORAL_TABLET | Freq: Every day | ORAL | 0 refills | Status: DC

## 2018-02-08 NOTE — Patient Instructions (Addendum)
No medication changes at this time.  Thank you for coming in today.  Follow-up in the next 4 to 6 months, or before medication runs out.  Let me know if there are questions prior to that time  Keeping you healthy  Get these tests  Blood pressure- Have your blood pressure checked once a year by your healthcare provider.  Normal blood pressure is 120/80  Weight- Have your body mass index (BMI) calculated to screen for obesity.  BMI is a measure of body fat based on height and weight. You can also calculate your own BMI at [email or web address].  Cholesterol- Have your cholesterol checked every year.  Diabetes- Have your blood sugar checked regularly if you have high blood pressure, high cholesterol, have a family history of diabetes or if you are overweight.  Screening for Colon Cancer- Colonoscopy starting at age 4.  Screening may begin sooner depending on your family history and other health conditions. Follow up colonoscopy as directed by your Gastroenterologist.  Screening for Prostate Cancer- Both blood work (PSA) and a rectal exam help screen for Prostate Cancer.  Screening begins at age 65 with African-American men and at age 54 with Caucasian men.  Screening may begin sooner depending on your family history.  Take these medicines  Aspirin- One aspirin daily can help prevent Heart disease and Stroke.  Flu shot- Every fall.  Tetanus- Every 10 years.  Zostavax- Once after the age of 7 to prevent Shingles.  Pneumonia shot- Once after the age of 39; if you are younger than 80, ask your healthcare provider if you need a Pneumonia shot.  Take these steps  Don't smoke- If you do smoke, talk to your doctor about quitting.  For tips on how to quit, go to www.smokefree.gov or call 1-800-QUIT-NOW.  Be physically active- Exercise 5 days a week for at least 30 minutes.  If you are not already physically active start slow and gradually work up to 30 minutes of moderate physical  activity.  Examples of moderate activity include walking briskly, mowing the yard, dancing, swimming, bicycling, etc.  Eat a healthy diet- Eat a variety of healthy food such as fruits, vegetables, low fat milk, low fat cheese, yogurt, lean meant, poultry, fish, beans, tofu, etc. For more information go to www.thenutritionsource.org  Drink alcohol in moderation- Limit alcohol intake to less than two drinks a day. Never drink and drive.  Dentist- Brush and floss twice daily; visit your dentist twice a year.  Depression- Your emotional health is as important as your physical health. If you're feeling down, or losing interest in things you would normally enjoy please talk to your healthcare provider.  Eye exam- Visit your eye doctor every year.  Safe sex- If you may be exposed to a sexually transmitted infection, use a condom.  Seat belts- Seat belts can save your life; always wear one.  Smoke/Carbon Monoxide detectors- These detectors need to be installed on the appropriate level of your home.  Replace batteries at least once a year.  Skin cancer- When out in the sun, cover up and use sunscreen 15 SPF or higher.  Violence- If anyone is threatening you, please tell your healthcare provider.  Living Will/ Health care power of attorney- Speak with your healthcare provider and family.  If you have lab work done today you will be contacted with your lab results within the next 2 weeks.  If you have not heard from [place] then please contact us. The fastest way to  get your results is to register for My Chart.   IF you received an x-ray today, you will receive an invoice from [facility]. Please contact [facility] at [phone] with questions or concerns regarding your invoice.   IF you received labwork today, you will receive an invoice from [place]. Please contact [facility] at [phone] with questions or concerns regarding your invoice.   Our billing staff will not be able  to assist you with questions regarding bills from these companies.  You will be contacted with the lab results as soon as they are available. The fastest way to get your results is to activate your My Chart account. Instructions are located on the last page of this paperwork. If you have not heard from [place] regarding the results in 2 weeks, please contact this office.

## 2018-02-08 NOTE — Progress Notes (Signed)
Subjective:    Patient ID: Joshua [name], male    DOB: Apr 23, 1955, 62 y.o.   MRN: [id number]  HPI Joshua [name] is a 62 y.o. male Presents today for: Chief Complaint  Patient presents with  . Annual Exam    CPE   Hyperlipidemia: On Lipitor 20mg  daily. (1/2 of 40),  Aspirin 81 mg daily. No new side effects. No GI or other bleeding  Lab Results  Component Value Date   CHOL 137 11/04/2017   HDL 51 11/04/2017   LDLCALC 71 11/04/2017   TRIG 74 11/04/2017   CHOLHDL 2.7 11/04/2017   Lab Results  Component Value Date   ALT 23 11/04/2017   AST 17 11/04/2017   ALKPHOS 77 11/04/2017   BILITOT 0.6 11/04/2017   Hypertension: BP Readings from Last 3 Encounters:  02/08/18 131/84  11/04/17 (!) 158/82  04/29/17 136/84   Lab Results  Component Value Date   CREATININE 1.05 11/04/2017  On Norvasc 10 mg daily, losartan 100 mg daily. No new side effects. Rare missed dose.  Obesity: Wt Readings from Last 3 Encounters:  02/08/18 249 lb 9.6 oz (113.2 kg)  11/04/17 252 lb (114.3 kg)  04/29/17 253 lb 3.2 oz (114.9 kg)  Body mass index is 33.85 kg/m. Exercise 3-4 days per week. occassional soda - cutting back on soda.  drinking more water.   Cancer screening; Colonoscopy 05/18/2016, repeat 10 years.  Normal PSA of 1.3 on 01/05/2017  Immunization History  Administered Date(s) Administered  . Influenza Nasal 11/19/2016  . Influenza Split 11/24/2010, 11/23/2013  . Influenza,inj,Quad PF,6+ Mos 12/02/2015  . Influenza-Unspecified 12/08/2017  . Tdap 01/05/2017  . Zoster Recombinat (Shingrix) 01/19/2017   HIV screen, hepatitis screen completed in 2017.   Visual Acuity Screening   Right eye Left eye Both eyes  Without correction: 20/40-1 20/40 20/30-1  With correction:     no recent optho visit- plans to schedule.   Dental: every 6 months.   Exercise: 3-4 days per week.   Patient Active Problem List   Diagnosis Date Noted  . Hyperlipidemia 11/30/2013  . Morbid  obesity ([place]) 11/30/2013  . DOE (dyspnea on exertion) 11/30/2013  . Other and unspecified hyperlipidemia 11/02/2013  . Stress at work 11/02/2013  . Erectile dysfunction 08/16/2012  . Hypertension 08/04/2011   Past Medical History:  Diagnosis Date  . Allergy   . Chronic kidney disease    kidney stones  . Hypercholesteremia   . Hypertension    Past Surgical History:  Procedure Laterality Date  . ANKLE SURGERY Right    no other information given  . COLONOSCOPY    . EYE SURGERY     Lasik   No Known Allergies Prior to Admission medications   Medication Sig Start Date End Date Taking? Authorizing Provider  amLODipine (NORVASC) 10 MG tablet Take 1 tablet (10 mg total) by mouth daily. 11/04/17  Yes [name], MD  aspirin EC 81 MG tablet Take 81 mg by mouth daily.   Yes [provider]  atorvastatin (LIPITOR) 40 MG tablet Take 0.5 tablets (20 mg total) by mouth daily. 11/04/17  Yes [name], MD  losartan (COZAAR) 100 MG tablet TAKE 1 TABLET (100 MG TOTAL) DAILY BY MOUTH. 01/14/18  Yes [name], MD   Social History   Socioeconomic History  . Marital status: Married    Spouse name: Not on file  . Number of children: 0  . Years of education: Not on file  .  Highest education level: Not on file  Occupational History  . Occupation: IT consultant: [employer]  . Financial resource strain: Not on file  . Food insecurity:    Worry: Not on file    Inability: Not on file  . Transportation needs:    Medical: Not on file    Non-medical: Not on file  Tobacco Use  . Smoking status: Never Smoker  . Smokeless tobacco: Never Used  Substance and Sexual Activity  . Alcohol use: Yes    Alcohol/week: 0.0 standard drinks    Comment: occ  . Drug use: No  . Sexual activity: Yes  Lifestyle  . Physical activity:    Days per week: Not on file    Minutes per session: Not on file  . Stress: Not on file  Relationships  . Social  connections:    Talks on phone: Not on file    Gets together: Not on file    Attends religious service: Not on file    Active member of club or organization: Not on file    Attends meetings of clubs or organizations: Not on file    Relationship status: Not on file  . Intimate partner violence:    Fear of current or ex partner: Not on file    Emotionally abused: Not on file    Physically abused: Not on file    Forced sexual activity: Not on file  Other Topics Concern  . Not on file  Social History Narrative   Married. Education: [employer].     Review of Systems  Allergic/Immunologic: Positive for environmental allergies.  tree pollen in the spring. No decongestants, controlled with otc meds.   13 point review of systems per patient health survey noted.  Negative other than as indicated above or in HPI.      Objective:   Physical Exam Vitals signs reviewed.  Constitutional:      Appearance: He is well-developed.  HENT:     Head: Normocephalic and atraumatic.     Right Ear: External ear normal.     Left Ear: External ear normal.  Eyes:     Conjunctiva/sclera: Conjunctivae normal.     Pupils: Pupils are equal, round, and reactive to light.  Neck:     Musculoskeletal: Normal range of motion and neck supple.     Thyroid: No thyromegaly.  Cardiovascular:     Rate and Rhythm: Normal rate and regular rhythm.     Heart sounds: Normal heart sounds.  Pulmonary:     Effort: Pulmonary effort is normal. No respiratory distress.     Breath sounds: Normal breath sounds. No wheezing.  Abdominal:     General: There is no distension.     Palpations: Abdomen is soft.     Tenderness: There is no abdominal tenderness.     Hernia: There is no hernia in the right inguinal area or left inguinal area.  Genitourinary:    Prostate: Normal.  Musculoskeletal: Normal range of motion.        General: No tenderness.  Lymphadenopathy:     Cervical: No cervical adenopathy.  Skin:    General:  Skin is warm and dry.  Neurological:     Mental Status: He is alert and oriented to person, place, and time.     Deep Tendon Reflexes: Reflexes are normal and symmetric.  Psychiatric:        Behavior: Behavior normal.     Vitals:  02/08/18 0930  BP: 131/84  Pulse: 79  Temp: 98.5 F (36.9 C)  TempSrc: Oral  SpO2: 97%  Weight: 249 lb 9.6 oz (113.2 kg)  Height: 6' (1.829 m)      Assessment & Plan:   Joshua [name] is a 62 y.o. male Annual physical exam  - -anticipatory guidance as below in AVS, screening labs above. Health maintenance items as above in HPI discussed/recommended as applicable.   -Continue to work on diet, exercise for weight loss with goal BMI less than 30 initially.  Commended on cutting back on sodas.  Mixed hyperlipidemia  - tolerating meds. No changes. Recheck in approx 4 months.   Screening for prostate cancer - Plan: PSA  - We discussed pros and cons of prostate cancer screening, and after this discussion, he chose to have screening done. PSA obtained, and no concerning findings on DRE.   Essential hypertension - Plan: losartan (COZAAR) 100 MG tablet    -Stable, no med changes.  Recent labs reassuring.  Meds ordered this encounter  Medications  . losartan (COZAAR) 100 MG tablet    Sig: Take 1 tablet (100 mg total) by mouth daily.    Dispense:  90 tablet    Refill:  0   Patient Instructions      No medication changes at this time.  Thank you for coming in today.  Follow-up in the next 4 to 6 months, or before medication runs out.  Let me know if there are questions prior to that time  Keeping you healthy  Get these tests  Blood pressure- Have your blood pressure checked once a year by your healthcare provider.  Normal blood pressure is 120/80  Weight- Have your body mass index (BMI) calculated to screen for obesity.  BMI is a measure of body fat based on height and weight. You can also calculate your own BMI at  ViewBanking.si.  Cholesterol- Have your cholesterol checked every year.  Diabetes- Have your blood sugar checked regularly if you have high blood pressure, high cholesterol, have a family history of diabetes or if you are overweight.  Screening for Colon Cancer- Colonoscopy starting at age 44.  Screening may begin sooner depending on your family history and other health conditions. Follow up colonoscopy as directed by your Gastroenterologist.  Screening for Prostate Cancer- Both blood work (PSA) and a rectal exam help screen for Prostate Cancer.  Screening begins at age 20 with African-American men and at age 7 with Caucasian men.  Screening may begin sooner depending on your family history.  Take these medicines  Aspirin- One aspirin daily can help prevent Heart disease and Stroke.  Flu shot- Every fall.  Tetanus- Every 10 years.  Zostavax- Once after the age of 46 to prevent Shingles.  Pneumonia shot- Once after the age of 51; if you are younger than 75, ask your healthcare provider if you need a Pneumonia shot.  Take these steps  Don't smoke- If you do smoke, talk to your doctor about quitting.  For tips on how to quit, go to www.smokefree.gov or call 1-800-QUIT-NOW.  Be physically active- Exercise 5 days a week for at least 30 minutes.  If you are not already physically active start slow and gradually work up to 30 minutes of moderate physical activity.  Examples of moderate activity include walking briskly, mowing the yard, dancing, swimming, bicycling, etc.  Eat a healthy diet- Eat a variety of healthy food such as fruits, vegetables, low fat milk, low fat  cheese, yogurt, lean meant, poultry, fish, beans, tofu, etc. For more information go to www.thenutritionsource.org  Drink alcohol in moderation- Limit alcohol intake to less than two drinks a day. Never drink and drive.  Dentist- Brush and floss twice daily; visit your dentist twice a year.  Depression- Your  emotional health is as important as your physical health. If you're feeling down, or losing interest in things you would normally enjoy please talk to your healthcare provider.  Eye exam- Visit your eye doctor every year.  Safe sex- If you may be exposed to a sexually transmitted infection, use a condom.  Seat belts- Seat belts can save your life; always wear one.  Smoke/Carbon Monoxide detectors- These detectors need to be installed on the appropriate level of your home.  Replace batteries at least once a year.  Skin cancer- When out in the sun, cover up and use sunscreen 15 SPF or higher.  Violence- If anyone is threatening you, please tell your healthcare provider.  Living Will/ Health care power of attorney- Speak with your healthcare provider and family.  If you have lab work done today you will be contacted with your lab results within the next 2 weeks.  If you have not heard from [place] then please contact us. The fastest way to get your results is to register for My Chart.   IF you received an x-ray today, you will receive an invoice from [facility]. Please contact [facility] at [phone] with questions or concerns regarding your invoice.   IF you received labwork today, you will receive an invoice from [facility]. Please contact [facility] at [phone] with questions or concerns regarding your invoice.   Our billing staff will not be able to assist you with questions regarding bills from these companies.  You will be contacted with the lab results as soon as they are available. The fastest way to get your results is to activate your My Chart account. Instructions are located on the last page of this paperwork. If you have not heard from [place] regarding the results in 2 weeks, please contact this office.       Signed,   Merri Ray, MD [facility].  02/08/18 10:13 AM

## 2018-02-09 LAB — PSA: Prostate Specific Ag, Serum: 1.7 ng/mL (ref 0.0–4.0)

## 2018-03-30 DIAGNOSIS — C44311 Basal cell carcinoma of skin of nose: Secondary | ICD-10-CM | POA: Diagnosis not present

## 2018-04-20 ENCOUNTER — Other Ambulatory Visit: Payer: Self-pay | Admitting: Family Medicine

## 2018-04-20 DIAGNOSIS — I10 Essential (primary) hypertension: Secondary | ICD-10-CM

## 2018-04-20 NOTE — Telephone Encounter (Signed)
Requested Prescriptions  Pending Prescriptions Disp Refills  . amLODipine (NORVASC) 10 MG tablet [Pharmacy Med Name: AMLODIPINE BESYLATE 10 MG TAB] 90 tablet 1    Sig: TAKE 1 TABLET BY MOUTH EVERY DAY     Cardiovascular:  Calcium Channel Blockers Passed - 04/20/2018  1:54 AM      Passed - Last BP in normal range    BP Readings from Last 1 Encounters:  02/08/18 131/84         Passed - Valid encounter within last 6 months    Recent Outpatient Visits          2 months ago Annual physical exam   [facility], Ranell Patrick, MD   5 months ago Nonintractable episodic headache, unspecified headache type   [facility], Ranell Patrick, MD   11 months ago Nasal congestion   [facility] [place], [place] D, PA-C   1 year ago Rhinosinusitis   [facility] [place], [place], [place]   1 year ago Annual physical exam   [facility], Ranell Patrick, MD      Future Appointments            In 1 month Carlota Raspberry Ranell Patrick, MD [facility] [place], [facility]

## 2018-06-03 ENCOUNTER — Other Ambulatory Visit: Payer: Self-pay | Admitting: Emergency Medicine

## 2018-06-03 DIAGNOSIS — E782 Mixed hyperlipidemia: Secondary | ICD-10-CM

## 2018-06-03 DIAGNOSIS — I10 Essential (primary) hypertension: Secondary | ICD-10-CM

## 2018-06-08 ENCOUNTER — Ambulatory Visit (INDEPENDENT_AMBULATORY_CARE_PROVIDER_SITE_OTHER): Payer: 59 | Admitting: Family Medicine

## 2018-06-08 ENCOUNTER — Other Ambulatory Visit: Payer: Self-pay

## 2018-06-08 DIAGNOSIS — I10 Essential (primary) hypertension: Secondary | ICD-10-CM

## 2018-06-08 DIAGNOSIS — E782 Mixed hyperlipidemia: Secondary | ICD-10-CM | POA: Diagnosis not present

## 2018-06-09 LAB — COMPREHENSIVE METABOLIC PANEL
ALT: 27 IU/L (ref 0–44)
AST: 19 IU/L (ref 0–40)
Albumin/Globulin Ratio: 1.8 (ref 1.2–2.2)
Albumin: 4.4 g/dL (ref 3.8–4.8)
Alkaline Phosphatase: 79 IU/L (ref 39–117)
BUN/Creatinine Ratio: 16 (ref 10–24)
BUN: 17 mg/dL (ref 8–27)
Bilirubin Total: 0.7 mg/dL (ref 0.0–1.2)
CO2: 22 mmol/L (ref 20–29)
Calcium: 9.7 mg/dL (ref 8.6–10.2)
Chloride: 103 mmol/L (ref 96–106)
Creatinine, Ser: 1.06 mg/dL (ref 0.76–1.27)
GFR calc Af Amer: 86 mL/min/{1.73_m2} (ref >59)
GFR calc non Af Amer: 74 mL/min/{1.73_m2} (ref >59)
Globulin, Total: 2.4 g/dL (ref 1.5–4.5)
Glucose: 89 mg/dL (ref 65–99)
Potassium: 4.4 mmol/L (ref 3.5–5.2)
Sodium: 141 mmol/L (ref 134–144)
Total Protein: 6.8 g/dL (ref 6.0–8.5)

## 2018-06-09 LAB — LIPID PANEL
Chol/HDL Ratio: 2.8 ratio (ref 0.0–5.0)
Cholesterol, Total: 147 mg/dL (ref 100–199)
HDL: 52 mg/dL (ref >39)
LDL Calculated: 82 mg/dL (ref 0–99)
Triglycerides: 65 mg/dL (ref 0–149)
VLDL Cholesterol Cal: 13 mg/dL (ref 5–40)

## 2018-06-10 ENCOUNTER — Ambulatory Visit: Payer: 59 | Admitting: Family Medicine

## 2018-07-10 ENCOUNTER — Other Ambulatory Visit: Payer: Self-pay | Admitting: Family Medicine

## 2018-07-10 DIAGNOSIS — I10 Essential (primary) hypertension: Secondary | ICD-10-CM

## 2018-08-09 ENCOUNTER — Other Ambulatory Visit: Payer: Self-pay | Admitting: Family Medicine

## 2018-08-09 DIAGNOSIS — I10 Essential (primary) hypertension: Secondary | ICD-10-CM

## 2018-08-09 NOTE — Telephone Encounter (Signed)
Epic is showing   OV on 06-08-2018 with Dr. Carlota Raspberry for hypertension  however there are no notes associated with the visit. / Medication failed protocol d/t needing a OV. / Will route to provider for review

## 2018-08-19 ENCOUNTER — Other Ambulatory Visit: Payer: Self-pay | Admitting: Internal Medicine

## 2018-08-19 DIAGNOSIS — Z20822 Contact with and (suspected) exposure to covid-19: Secondary | ICD-10-CM

## 2018-08-19 NOTE — Progress Notes (Signed)
[id number]

## 2018-08-22 ENCOUNTER — Telehealth (INDEPENDENT_AMBULATORY_CARE_PROVIDER_SITE_OTHER): Payer: 59 | Admitting: Family Medicine

## 2018-08-22 VITALS — BP 126/84 | Temp 97.9°F | Ht 72.0 in | Wt 247.0 lb

## 2018-08-22 DIAGNOSIS — E782 Mixed hyperlipidemia: Secondary | ICD-10-CM | POA: Diagnosis not present

## 2018-08-22 DIAGNOSIS — I10 Essential (primary) hypertension: Secondary | ICD-10-CM | POA: Diagnosis not present

## 2018-08-22 DIAGNOSIS — Z6833 Body mass index (BMI) 33.0-33.9, adult: Secondary | ICD-10-CM

## 2018-08-22 MED ORDER — AMLODIPINE BESYLATE 10 MG PO TABS: 10.0000 mg | ORAL_TABLET | Freq: Every day | ORAL | 1 refills | Status: DC

## 2018-08-22 MED ORDER — LOSARTAN POTASSIUM 100 MG PO TABS: 100.0000 mg | ORAL_TABLET | Freq: Every day | ORAL | 1 refills | Status: DC

## 2018-08-22 NOTE — Progress Notes (Signed)
Chief Complaint: Hypertension f/u , Med refill (pending)

## 2018-08-22 NOTE — Progress Notes (Signed)
Virtual Visit via Telephone Note  I connected with Joshua [name] on 08/22/18 at 9:09 AM by telephone and verified that I am speaking with the correct person using two identifiers.   I discussed the limitations, risks, security and privacy concerns of performing an evaluation and management service by telephone and the availability of in person appointments. I also discussed with the patient that there may be a patient responsible charge related to this service. The patient expressed understanding and agreed to proceed, consent obtained  Chief complaint: HTN, med review.   History of Present Illness: Joshua [name] is a 63 y.o. male  Hypertension: BP Readings from Last 3 Encounters:  08/22/18 126/84  02/08/18 131/84  11/04/17 (!) 158/82   Lab Results  Component Value Date   CREATININE 1.06 06/08/2018  Takes amlodipine 10 mg, losartan 100 mg daily. Home readings controlled, consistent readings.  Constitutional: Negative for fatigue and unexpected weight change.  Eyes: Negative for visual disturbance.  Respiratory: Negative for cough, chest tightness and shortness of breath.   Cardiovascular: Negative for chest pain, palpitations and leg swelling.  Gastrointestinal: Negative for abdominal pain and blood in stool.  Neurological: Negative for dizziness, light-headedness and headaches.     Hyperlipidemia:  Lab Results  Component Value Date   CHOL 147 06/08/2018   HDL 52 06/08/2018   LDLCALC 82 06/08/2018   TRIG 65 06/08/2018   CHOLHDL 2.8 06/08/2018   Lab Results  Component Value Date   ALT 27 06/08/2018   AST 19 06/08/2018   ALKPHOS 79 06/08/2018   BILITOT 0.7 06/08/2018  Takes Lipitor 40 mg daily, no new myalgias or other new side effects.   Obesity: Body mass index is 33.5 kg/m. Wt Readings from Last 3 Encounters:  08/22/18 247 lb (112 kg)  02/08/18 249 lb 9.6 oz (113.2 kg)  11/04/17 252 lb (114.3 kg)  had improved further, with some recent gain.   [employer] for exercise before it was closed.  Working in garden.    Had Covid testing recently - no symptoms, concern for possible exposure, but that turned out to be ok.      Patient Active Problem List   Diagnosis Date Noted  . Hyperlipidemia 11/30/2013  . Morbid obesity ([place]) 11/30/2013  . DOE (dyspnea on exertion) 11/30/2013  . Other and unspecified hyperlipidemia 11/02/2013  . Stress at work 11/02/2013  . Erectile dysfunction 08/16/2012  . Hypertension 08/04/2011   Past Medical History:  Diagnosis Date  . Allergy   . Chronic kidney disease    kidney stones  . Hypercholesteremia   . Hypertension    Past Surgical History:  Procedure Laterality Date  . ANKLE SURGERY Right    no other information given  . COLONOSCOPY    . EYE SURGERY     Lasik   No Known Allergies Prior to Admission medications   Medication Sig Start Date End Date Taking? Authorizing Provider  amLODipine (NORVASC) 10 MG tablet TAKE 1 TABLET BY MOUTH EVERY DAY 07/10/18  Yes [name], MD  aspirin EC 81 MG tablet Take 81 mg by mouth daily.   Yes [provider]  atorvastatin (LIPITOR) 40 MG tablet Take 0.5 tablets (20 mg total) by mouth daily. 11/04/17  Yes [name], MD  losartan (COZAAR) 100 MG tablet Take 1 tablet (100 mg total) by mouth daily. 02/08/18  Yes [name], MD   Social History   Socioeconomic History  . Marital status: Married  Spouse name: Not on file  . Number of children: 0  . Years of education: Not on file  . Highest education level: Not on file  Occupational History  . Occupation: IT consultant: [employer]  . Financial resource strain: Not on file  . Food insecurity    Worry: Not on file    Inability: Not on file  . Transportation needs    Medical: Not on file    Non-medical: Not on file  Tobacco Use  . Smoking status: Never Smoker  . Smokeless tobacco: Never Used  Substance and Sexual Activity  . Alcohol use: Yes     Alcohol/week: 0.0 standard drinks    Comment: occ  . Drug use: No  . Sexual activity: Yes  Lifestyle  . Physical activity    Days per week: Not on file    Minutes per session: Not on file  . Stress: Not on file  Relationships  . Social Herbalist on phone: Not on file    Gets together: Not on file    Attends religious service: Not on file    Active member of club or organization: Not on file    Attends meetings of clubs or organizations: Not on file    Relationship status: Not on file  . Intimate partner violence    Fear of current or ex partner: Not on file    Emotionally abused: Not on file    Physically abused: Not on file    Forced sexual activity: Not on file  Other Topics Concern  . Not on file  Social History Narrative   Married. Education: [employer].      Observations/Objective: Vitals:   08/22/18 0831  BP: 126/84  Temp: 97.9 F (36.6 C)  TempSrc: Oral  Weight: 247 lb (112 kg)  Height: 6' (1.829 m)  reported by patient.    Assessment and Plan: Mixed hyperlipidemia - Plan:   -Tolerating current regimen, labs in April overall looked okay.  We will continue same regimen with follow-up lab work in the next 4 to 6 months.  Essential hypertension - Plan: amLODipine (NORVASC) 10 MG tablet, losartan (COZAAR) 100 MG tablet,   -Controlled on current regimen without any side effects, continue same with recheck in the next 6 months  BMI 33.0-33.9,adult - Plan:   -Commended on previous weight loss.  Continue exercise as able since fitness facility has been closed.  Follow-up 6 months   Follow Up Instructions:    I discussed the assessment and treatment plan with the patient. The patient was provided an opportunity to ask questions and all were answered. The patient agreed with the plan and demonstrated an understanding of the instructions.   The patient was advised to call back or seek an in-person evaluation if the symptoms worsen or if the  condition fails to improve as anticipated.  I provided 9 minutes of non-face-to-face time during this encounter.  Signed,   Merri Ray, MD [facility].  08/22/18

## 2018-08-24 LAB — NOVEL CORONAVIRUS, NAA: SARS-CoV-2, NAA: NOT DETECTED

## 2019-01-04 ENCOUNTER — Telehealth: Payer: Self-pay | Admitting: Family Medicine

## 2019-01-04 NOTE — Telephone Encounter (Signed)
provider out of town LVM for pt to reschedule FR

## 2019-02-09 ENCOUNTER — Other Ambulatory Visit: Payer: Self-pay | Admitting: Family Medicine

## 2019-02-09 DIAGNOSIS — I10 Essential (primary) hypertension: Secondary | ICD-10-CM

## 2019-02-09 NOTE — Telephone Encounter (Signed)
Forwarding medication refill request to the clinical pool for review. 

## 2019-02-22 ENCOUNTER — Encounter: Payer: 59 | Admitting: Family Medicine

## 2019-03-08 ENCOUNTER — Ambulatory Visit (INDEPENDENT_AMBULATORY_CARE_PROVIDER_SITE_OTHER): Payer: 59 | Admitting: Family Medicine

## 2019-03-08 ENCOUNTER — Other Ambulatory Visit: Payer: Self-pay

## 2019-03-08 ENCOUNTER — Encounter: Payer: Self-pay | Admitting: Family Medicine

## 2019-03-08 VITALS — BP 126/86 | HR 72 | Temp 98.5°F | Ht 72.0 in | Wt 248.0 lb

## 2019-03-08 DIAGNOSIS — Z0001 Encounter for general adult medical examination with abnormal findings: Secondary | ICD-10-CM | POA: Diagnosis not present

## 2019-03-08 DIAGNOSIS — I10 Essential (primary) hypertension: Secondary | ICD-10-CM

## 2019-03-08 DIAGNOSIS — Z6833 Body mass index (BMI) 33.0-33.9, adult: Secondary | ICD-10-CM

## 2019-03-08 DIAGNOSIS — E782 Mixed hyperlipidemia: Secondary | ICD-10-CM | POA: Diagnosis not present

## 2019-03-08 DIAGNOSIS — H9193 Unspecified hearing loss, bilateral: Secondary | ICD-10-CM

## 2019-03-08 DIAGNOSIS — Z0000 Encounter for general adult medical examination without abnormal findings: Secondary | ICD-10-CM

## 2019-03-08 DIAGNOSIS — H9313 Tinnitus, bilateral: Secondary | ICD-10-CM

## 2019-03-08 MED ORDER — ATORVASTATIN CALCIUM 40 MG PO TABS: 20.0000 mg | ORAL_TABLET | Freq: Every day | ORAL | 1 refills | Status: DC

## 2019-03-08 MED ORDER — LOSARTAN POTASSIUM 100 MG PO TABS: 100.0000 mg | ORAL_TABLET | Freq: Every day | ORAL | 1 refills | Status: DC

## 2019-03-08 MED ORDER — AMLODIPINE BESYLATE 10 MG PO TABS: 10.0000 mg | ORAL_TABLET | Freq: Every day | ORAL | 1 refills | Status: DC

## 2019-03-08 NOTE — Progress Notes (Signed)
Subjective:  Patient ID: Joshua [name], male    DOB: Nov 16, 1955  Age: 64 y.o. MRN: [id number]  CC:  Chief Complaint  Patient presents with  . Annual Exam    general health pt feels good no complaints.  . Tinnitus    pt is having ringing in his ears has gotten worse over the past 6 months. ringing is constant, but no pain.    HPI Joshua [name] presents for   Annual physical exam  Tinnitus No associated pain, worse past 6 months.  Off and on for a long time, more steady past 6 months louder.  Not affecting sleep, no depression/SI.  No supplements.  Some decreased hearing past year. No recent hearing test.  No acute change in hearing, no vertigo. Loud noise exposure when younger.    Hypertension: Amlodipine 10 mg daily, losartan 100 mg daily.  No new side effects.  Home readings:126/80, 128/84.  BP Readings from Last 3 Encounters:  03/08/19 126/86  08/22/18 126/84  02/08/18 131/84  repeat 126/86  Lab Results  Component Value Date   CREATININE 1.06 06/08/2018   Hyperlipidemia: Atorvastatin 20 mg daily (1/2 of 40).  No new myalgias. Occasional sore muscle.  Lab Results  Component Value Date   CHOL 147 06/08/2018   HDL 52 06/08/2018   LDLCALC 82 06/08/2018   TRIG 65 06/08/2018   CHOLHDL 2.8 06/08/2018   Lab Results  Component Value Date   ALT 27 06/08/2018   AST 19 06/08/2018   ALKPHOS 79 06/08/2018   BILITOT 0.7 06/08/2018   Obesity: Wt Readings from Last 3 Encounters:  03/08/19 248 lb (112.5 kg)  08/22/18 247 lb (112 kg)  02/08/18 249 lb 9.6 oz (113.2 kg)   Body mass index is 33.63 kg/m.  Trying to increase walking. Limited by prior ankle injury when younger- cold weather affects more.  No sweet tea, cutting back on soda.  No fast food.  Cancer screening: Colonoscopy 05/18/2016 Prostate, last tested in 2019, normal - after r/b of testing, agrees to PSA only, with limitations discussed without DRE.  Lab Results  Component Value Date   PSA1 1.7 02/08/2018   PSA1 1.3 01/05/2017   PSA 1.1 12/02/2015   PSA 1.18 11/06/2014   PSA 1.00 11/02/2013   Immunization History  Administered Date(s) Administered  . Influenza Nasal 11/19/2016  . Influenza Split 11/24/2010, 11/23/2013  . Influenza,inj,Quad PF,6+ Mos 12/02/2015, 11/11/2018  . Influenza-Unspecified 12/08/2017  . Tdap 01/05/2017  . Zoster Recombinat (Shingrix) 01/19/2017     Depression screen [facility] 2/9 03/08/2019 08/22/2018 02/08/2018 11/04/2017 04/29/2017  Decreased Interest 0 0 0 0 0  Down, Depressed, Hopeless 0 0 0 0 0  PHQ - 2 Score 0 0 0 0 0    No exam data present Plans on optho appt.   Dental:every 6 months.   Exercise: walking when able 6 days per week, now 2-3 days.    History Patient Active Problem List   Diagnosis Date Noted  . Hyperlipidemia 11/30/2013  . Morbid obesity ([place]) 11/30/2013  . DOE (dyspnea on exertion) 11/30/2013  . Other and unspecified hyperlipidemia 11/02/2013  . Stress at work 11/02/2013  . Erectile dysfunction 08/16/2012  . Hypertension 08/04/2011   Past Medical History:  Diagnosis Date  . Allergy   . Chronic kidney disease    kidney stones  . Hypercholesteremia   . Hypertension    Past Surgical History:  Procedure Laterality Date  . ANKLE SURGERY Right    no other  information given  . COLONOSCOPY    . EYE SURGERY     Lasik   No Known Allergies Prior to Admission medications   Medication Sig Start Date End Date Taking? Authorizing Provider  amLODipine (NORVASC) 10 MG tablet Take 1 tablet (10 mg total) by mouth daily. 08/22/18   [name], MD  aspirin EC 81 MG tablet Take 81 mg by mouth daily.    [provider]  atorvastatin (LIPITOR) 40 MG tablet Take 0.5 tablets (20 mg total) by mouth daily. 11/04/17   [name], MD  losartan (COZAAR) 50 MG tablet TAKE 2 TABLETS (100MG ) EVERY DAY 02/09/19   [name], MD   Social History   Socioeconomic History  . Marital status: Married     Spouse name: Not on file  . Number of children: 0  . Years of education: Not on file  . Highest education level: Not on file  Occupational History  . Occupation: IT consultant: [employer]  Tobacco Use  . Smoking status: Never Smoker  . Smokeless tobacco: Never Used  Substance and Sexual Activity  . Alcohol use: Yes    Alcohol/week: 0.0 standard drinks    Comment: occ  . Drug use: No  . Sexual activity: Yes  Other Topics Concern  . Not on file  Social History Narrative   Married. Education: [employer].    Social Determinants of Health   Financial Resource Strain:   . Difficulty of Paying Living Expenses: Not on file  Food Insecurity:   . Worried About Charity fundraiser in the Last Year: Not on file  . Ran Out of Food in the Last Year: Not on file  Transportation Needs:   . Lack of Transportation (Medical): Not on file  . Lack of Transportation (Non-Medical): Not on file  Physical Activity:   . Days of Exercise per Week: Not on file  . Minutes of Exercise per Session: Not on file  Stress:   . Feeling of Stress : Not on file  Social Connections:   . Frequency of Communication with Friends and Family: Not on file  . Frequency of Social Gatherings with Friends and Family: Not on file  . Attends Religious Services: Not on file  . Active Member of Clubs or Organizations: Not on file  . Attends Archivist Meetings: Not on file  . Marital Status: Not on file  Intimate Partner Violence:   . Fear of Current or Ex-Partner: Not on file  . Emotionally Abused: Not on file  . Physically Abused: Not on file  . Sexually Abused: Not on file    Review of Systems  13 point review of systems per patient health survey noted.  Negative other than as indicated above or in HPI.   Objective:   Vitals:   03/08/19 1123 03/08/19 1156  BP: (!) 145/91 126/86  Pulse: 72   Temp: 98.5 F (36.9 C)   TempSrc: Temporal   SpO2: 98%   Weight: 248 lb (112.5 kg)   Height:  6' (1.829 m)      Physical Exam Vitals reviewed.  Constitutional:      Appearance: He is well-developed.  HENT:     Head: Normocephalic and atraumatic.     Right Ear: Tympanic membrane, ear canal and external ear normal.     Left Ear: Tympanic membrane, ear canal and external ear normal.  Eyes:     Conjunctiva/sclera: Conjunctivae normal.  Pupils: Pupils are equal, round, and reactive to light.  Neck:     Thyroid: No thyromegaly.  Cardiovascular:     Rate and Rhythm: Normal rate and regular rhythm.     Heart sounds: Normal heart sounds.  Pulmonary:     Effort: Pulmonary effort is normal. No respiratory distress.     Breath sounds: Normal breath sounds. No wheezing.  Abdominal:     General: There is no distension.     Palpations: Abdomen is soft.     Tenderness: There is no abdominal tenderness.  Musculoskeletal:        General: No tenderness. Normal range of motion.     Cervical back: Normal range of motion and neck supple.  Lymphadenopathy:     Cervical: No cervical adenopathy.  Skin:    General: Skin is warm and dry.  Neurological:     Mental Status: He is alert and oriented to person, place, and time.     Deep Tendon Reflexes: Reflexes are normal and symmetric.  Psychiatric:        Behavior: Behavior normal.        Assessment & Plan:  SELESTINO GRAINER is a 64 y.o. male . Essential hypertension - Plan: losartan (COZAAR) 100 MG tablet, amLODipine (NORVASC) 10 MG tablet  -  Stable, tolerating current regimen. Medications refilled. Labs pending as above.   Mixed hyperlipidemia - Plan: atorvastatin (LIPITOR) 40 MG tablet  - Stable, tolerating current regimen. Medications refilled. Labs pending as above.   Annual physical exam - Plan: Lipid panel, Comprehensive metabolic panel, Hemoglobin A1c, PSA  - -anticipatory guidance as below in AVS, screening labs above. Health maintenance items as above in HPI discussed/recommended as applicable.   BMI  33.0-33.9,adult  -Continue diet/exercise approach for weight loss.  Tinnitus of both ears - Plan: Ambulatory referral to Audiology, Ambulatory referral to ENT Decreased hearing of both ears - Plan: Ambulatory referral to Audiology, Ambulatory referral to ENT  -Progressive symptoms without acute changes.  Referred to audiology to evaluate for hearing loss and ENT.  Isoflavonoids discussed.  Handout given.  Meds ordered this encounter  Medications  . atorvastatin (LIPITOR) 40 MG tablet    Sig: Take 0.5 tablets (20 mg total) by mouth daily.    Dispense:  90 tablet    Refill:  1  . losartan (COZAAR) 100 MG tablet    Sig: Take 1 tablet (100 mg total) by mouth daily.    Dispense:  90 tablet    Refill:  1  . amLODipine (NORVASC) 10 MG tablet    Sig: Take 1 tablet (10 mg total) by mouth daily.    Dispense:  90 tablet    Refill:  1   Patient Instructions     I will refer you for updated hearing test, and ENT specialist for hearing issue and ringing. Over the counter isoflavinoids may help.  Return to the clinic or go to the nearest emergency room if any of your symptoms worsen or new symptoms occur.  No other med changes. Recheck in 6 months.  Thanks for coming in today.     Tinnitus Tinnitus refers to hearing a sound when there is no actual source for that sound. This is often described as ringing in the ears. However, people with this condition may hear a variety of noises, in one ear or in both ears. The sounds of tinnitus can be soft, loud, or somewhere in between. Tinnitus can last for a few seconds or can be  constant for days. It may go away without treatment and come back at various times. When tinnitus is constant or happens often, it can lead to other problems, such as trouble sleeping and trouble concentrating. Almost everyone experiences tinnitus at some point. Tinnitus that is long-lasting (chronic) or comes back often (recurs) may require medical attention. What are the  causes? The cause of tinnitus is often not known. In some cases, it can result from:  Exposure to loud noises from machinery, music, or other sources.  An object (foreign body) stuck in the ear.  Earwax buildup.  Drinking alcohol or caffeine.  Taking certain medicines.  Age-related hearing loss. It may also be caused by medical conditions such as:  Ear or sinus infections.  High blood pressure.  Heart diseases.  Anemia.  Allergies.  Meniere's disease.  Thyroid problems.  Tumors.  A weak, bulging blood vessel (aneurysm) near the ear. What are the signs or symptoms? The main symptom of tinnitus is hearing a sound when there is no source for that sound. It may sound like:  Buzzing.  Roaring.  Ringing.  Blowing air.  Hissing.  Whistling.  Sizzling.  Humming.  Running water.  A musical note.  Tapping. Symptoms may affect only one ear (unilateral) or both ears (bilateral). How is this diagnosed? Tinnitus is diagnosed based on your symptoms, your medical history, and a physical exam. Your health care provider may do a thorough hearing test (audiologic exam) if your tinnitus:  Is unilateral.  Causes hearing difficulties.  Lasts 6 months or longer. You may work with a health care provider who specializes in hearing disorders (audiologist). You may be asked questions about your symptoms and how they affect your daily life. You may have other tests done, such as:  CT scan.  MRI.  An imaging test of how blood flows through your blood vessels (angiogram). How is this treated? Treating an underlying medical condition can sometimes make tinnitus go away. If your tinnitus continues, other treatments may include:  Medicines.  Therapy and counseling to help you manage the stress of living with tinnitus.  Sound generators to mask the tinnitus. These include: ? Tabletop sound machines that play relaxing sounds to help you fall asleep. ? Wearable devices  that fit in your ear and play sounds or music. ? Acoustic neural stimulation. This involves using headphones to listen to music that contains an auditory signal. Over time, listening to this signal may change some pathways in your brain and make you less sensitive to tinnitus. This treatment is used for very severe cases when no other treatment is working.  Using hearing aids or cochlear implants if your tinnitus is related to hearing loss. Hearing aids are worn in the outer ear. Cochlear implants are surgically placed in the inner ear. Follow these instructions at home: Managing symptoms      When possible, avoid being in loud places and being exposed to loud sounds.  Wear hearing protection, such as earplugs, when you are exposed to loud noises.  Use a white noise machine, a humidifier, or other devices to mask the sound of tinnitus.  Practice techniques for reducing stress, such as meditation, yoga, or deep breathing. Work with your health care provider if you need help with managing stress.  Sleep with your head slightly raised. This may reduce the impact of tinnitus. General instructions  Do not use stimulants, such as nicotine, alcohol, or caffeine. Talk with your health care provider about other stimulants to avoid. Stimulants  are substances that can make you feel alert and attentive by increasing certain activities in the body (such as heart rate and blood pressure). These substances may make tinnitus worse.  Take over-the-counter and prescription medicines only as told by your health care provider.  Try to get plenty of sleep each night.  Keep all follow-up visits as told by your health care provider. This is important. Contact a health care provider if:  Your tinnitus continues for 3 weeks or longer without stopping.  You develop sudden hearing loss.  Your symptoms get worse or do not get better with home care.  You feel you are not able to manage the stress of living  with tinnitus. Get help right away if:  You develop tinnitus after a head injury.  You have tinnitus along with any of the following: ? Dizziness. ? Loss of balance. ? Nausea and vomiting. ? Sudden, severe headache. These symptoms may represent a serious problem that is an emergency. Do not wait to see if the symptoms will go away. Get medical help right away. Call your local emergency services (911 in the [place] Do not drive yourself to the hospital. Summary  Tinnitus refers to hearing a sound when there is no actual source for that sound. This is often described as ringing in the ears.  Symptoms may affect only one ear (unilateral) or both ears (bilateral).  Use a white noise machine, a humidifier, or other devices to mask the sound of tinnitus.  Do not use stimulants, such as nicotine, alcohol, or caffeine. Talk with your health care provider about other stimulants to avoid. These substances may make tinnitus worse. This information is not intended to replace advice given to you by your health care provider. Make sure you discuss any questions you have with your health care provider. Document Revised: 08/24/2018 Document Reviewed: 11/19/2016 Elsevier Patient Education  2020 [place] you healthy  Get these tests  Blood pressure- Have your blood pressure checked once a year by your healthcare provider.  Normal blood pressure is 120/80  Weight- Have your body mass index (BMI) calculated to screen for obesity.  BMI is a measure of body fat based on height and weight. You can also calculate your own BMI at ViewBank[employer].si.  Cholesterol- Have your cholesterol checked every year.  Diabetes- Have your blood sugar checked regularly if you have high blood pressure, high cholesterol, have a family history of diabetes or if you are overweight.  Screening for Colon Cancer- Colonoscopy starting at age 79.  Screening may begin sooner depending on your family history  and other health conditions. Follow up colonoscopy as directed by your Gastroenterologist.  Screening for Prostate Cancer- Both blood work (PSA) and a rectal exam help screen for Prostate Cancer.  Screening begins at age 25 with African-American men and at age 71 with Caucasian men.  Screening may begin sooner depending on your family history.  Take these medicines  Aspirin- One aspirin daily can help prevent Heart disease and Stroke.  Flu shot- Every fall.  Tetanus- Every 10 years.  Zostavax- Once after the age of 53 to prevent Shingles.  Pneumonia shot- Once after the age of 59; if you are younger than 79, ask your healthcare provider if you need a Pneumonia shot.  Take these steps  Don't smoke- If you do smoke, talk to your doctor about quitting.  For tips on how to quit, go to www.smokefree.gov or call 1-800-QUIT-NOW.  Be physically active- Exercise 5 days  a week for at least 30 minutes.  If you are not already physically active start slow and gradually work up to 30 minutes of moderate physical activity.  Examples of moderate activity include walking briskly, mowing the yard, dancing, swimming, bicycling, etc.  Eat a healthy diet- Eat a variety of healthy food such as fruits, vegetables, low fat milk, low fat cheese, yogurt, lean meant, poultry, fish, beans, tofu, etc. For more information go to www.thenutritionsource.org  Drink alcohol in moderation- Limit alcohol intake to less than two drinks a day. Never drink and drive.  Dentist- Brush and floss twice daily; visit your dentist twice a year.  Depression- Your emotional health is as important as your physical health. If you're feeling down, or losing interest in things you would normally enjoy please talk to your healthcare provider.  Eye exam- Visit your eye doctor every year.  Safe sex- If you may be exposed to a sexually transmitted infection, use a condom.  Seat belts- Seat belts can save your life; always wear  one.  Smoke/Carbon Monoxide detectors- These detectors need to be installed on the appropriate level of your home.  Replace batteries at least once a year.  Skin cancer- When out in the sun, cover up and use sunscreen 15 SPF or higher.  Violence- If anyone is threatening you, please tell your healthcare provider.  Living Will/ Health care power of attorney- Speak with your healthcare provider and family.   If you have lab work done today you will be contacted with your lab results within the next 2 weeks.  If you have not heard from [place] then please contact us. The fastest way to get your results is to register for My Chart.   IF you received an x-ray today, you will receive an invoice from [facility]. Please contact [facility] at [phone] with questions or concerns regarding your invoice.   IF you received labwork today, you will receive an invoice from [place]. Please contact [facility] at [phone] with questions or concerns regarding your invoice.   Our billing staff will not be able to assist you with questions regarding bills from these companies.  You will be contacted with the lab results as soon as they are available. The fastest way to get your results is to activate your My Chart account. Instructions are located on the last page of this paperwork. If you have not heard from [place] regarding the results in 2 weeks, please contact this office.         Signed, Merri Ray, MD [facility]

## 2019-03-08 NOTE — Patient Instructions (Addendum)
I will refer you for updated hearing test, and ENT specialist for hearing issue and ringing. Over the counter isoflavinoids may help.  Return to the clinic or go to the nearest emergency room if any of your symptoms worsen or new symptoms occur.  No other med changes. Recheck in 6 months.  Thanks for coming in today.     Tinnitus Tinnitus refers to hearing a sound when there is no actual source for that sound. This is often described as ringing in the ears. However, people with this condition may hear a variety of noises, in one ear or in both ears. The sounds of tinnitus can be soft, loud, or somewhere in between. Tinnitus can last for a few seconds or can be constant for days. It may go away without treatment and come back at various times. When tinnitus is constant or happens often, it can lead to other problems, such as trouble sleeping and trouble concentrating. Almost everyone experiences tinnitus at some point. Tinnitus that is long-lasting (chronic) or comes back often (recurs) may require medical attention. What are the causes? The cause of tinnitus is often not known. In some cases, it can result from:  Exposure to loud noises from machinery, music, or other sources.  An object (foreign body) stuck in the ear.  Earwax buildup.  Drinking alcohol or caffeine.  Taking certain medicines.  Age-related hearing loss. It may also be caused by medical conditions such as:  Ear or sinus infections.  High blood pressure.  Heart diseases.  Anemia.  Allergies.  Meniere's disease.  Thyroid problems.  Tumors.  A weak, bulging blood vessel (aneurysm) near the ear. What are the signs or symptoms? The main symptom of tinnitus is hearing a sound when there is no source for that sound. It may sound like:  Buzzing.  Roaring.  Ringing.  Blowing air.  Hissing.  Whistling.  Sizzling.  Humming.  Running water.  A musical note.  Tapping. Symptoms may affect  only one ear (unilateral) or both ears (bilateral). How is this diagnosed? Tinnitus is diagnosed based on your symptoms, your medical history, and a physical exam. Your health care provider may do a thorough hearing test (audiologic exam) if your tinnitus:  Is unilateral.  Causes hearing difficulties.  Lasts 6 months or longer. You may work with a health care provider who specializes in hearing disorders (audiologist). You may be asked questions about your symptoms and how they affect your daily life. You may have other tests done, such as:  CT scan.  MRI.  An imaging test of how blood flows through your blood vessels (angiogram). How is this treated? Treating an underlying medical condition can sometimes make tinnitus go away. If your tinnitus continues, other treatments may include:  Medicines.  Therapy and counseling to help you manage the stress of living with tinnitus.  Sound generators to mask the tinnitus. These include: ? Tabletop sound machines that play relaxing sounds to help you fall asleep. ? Wearable devices that fit in your ear and play sounds or music. ? Acoustic neural stimulation. This involves using headphones to listen to music that contains an auditory signal. Over time, listening to this signal may change some pathways in your brain and make you less sensitive to tinnitus. This treatment is used for very severe cases when no other treatment is working.  Using hearing aids or cochlear implants if your tinnitus is related to hearing loss. Hearing aids are worn in the outer ear. Cochlear implants  are surgically placed in the inner ear. Follow these instructions at home: Managing symptoms      When possible, avoid being in loud places and being exposed to loud sounds.  Wear hearing protection, such as earplugs, when you are exposed to loud noises.  Use a white noise machine, a humidifier, or other devices to mask the sound of tinnitus.  Practice techniques  for reducing stress, such as meditation, yoga, or deep breathing. Work with your health care provider if you need help with managing stress.  Sleep with your head slightly raised. This may reduce the impact of tinnitus. General instructions  Do not use stimulants, such as nicotine, alcohol, or caffeine. Talk with your health care provider about other stimulants to avoid. Stimulants are substances that can make you feel alert and attentive by increasing certain activities in the body (such as heart rate and blood pressure). These substances may make tinnitus worse.  Take over-the-counter and prescription medicines only as told by your health care provider.  Try to get plenty of sleep each night.  Keep all follow-up visits as told by your health care provider. This is important. Contact a health care provider if:  Your tinnitus continues for 3 weeks or longer without stopping.  You develop sudden hearing loss.  Your symptoms get worse or do not get better with home care.  You feel you are not able to manage the stress of living with tinnitus. Get help right away if:  You develop tinnitus after a head injury.  You have tinnitus along with any of the following: ? Dizziness. ? Loss of balance. ? Nausea and vomiting. ? Sudden, severe headache. These symptoms may represent a serious problem that is an emergency. Do not wait to see if the symptoms will go away. Get medical help right away. Call your local emergency services (911 in the [place] Do not drive yourself to the hospital. Summary  Tinnitus refers to hearing a sound when there is no actual source for that sound. This is often described as ringing in the ears.  Symptoms may affect only one ear (unilateral) or both ears (bilateral).  Use a white noise machine, a humidifier, or other devices to mask the sound of tinnitus.  Do not use stimulants, such as nicotine, alcohol, or caffeine. Talk with your health care provider about  other stimulants to avoid. These substances may make tinnitus worse. This information is not intended to replace advice given to you by your health care provider. Make sure you discuss any questions you have with your health care provider. Document Revised: 08/24/2018 Document Reviewed: 11/19/2016 Elsevier Patient Education  2020 [employer] you healthy  Get these tests  Blood pressure- Have your blood pressure checked once a year by your healthcare provider.  Normal blood pressure is 120/80  Weight- Have your body mass index (BMI) calculated to screen for obesity.  BMI is a measure of body fat based on height and weight. You can also calculate your own BMI at ViewBanking.si.  Cholesterol- Have your cholesterol checked every year.  Diabetes- Have your blood sugar checked regularly if you have high blood pressure, high cholesterol, have a family history of diabetes or if you are overweight.  Screening for Colon Cancer- Colonoscopy starting at age 45.  Screening may begin sooner depending on your family history and other health conditions. Follow up colonoscopy as directed by your Gastroenterologist.  Screening for Prostate Cancer- Both blood work (PSA) and a rectal exam help screen  for Prostate Cancer.  Screening begins at age 93 with African-American men and at age 52 with Caucasian men.  Screening may begin sooner depending on your family history.  Take these medicines  Aspirin- One aspirin daily can help prevent Heart disease and Stroke.  Flu shot- Every fall.  Tetanus- Every 10 years.  Zostavax- Once after the age of 44 to prevent Shingles.  Pneumonia shot- Once after the age of 63; if you are younger than 3, ask your healthcare provider if you need a Pneumonia shot.  Take these steps  Don't smoke- If you do smoke, talk to your doctor about quitting.  For tips on how to quit, go to www.smokefree.gov or call 1-800-QUIT-NOW.  Be physically active- Exercise  5 days a week for at least 30 minutes.  If you are not already physically active start slow and gradually work up to 30 minutes of moderate physical activity.  Examples of moderate activity include walking briskly, mowing the yard, dancing, swimming, bicycling, etc.  Eat a healthy diet- Eat a variety of healthy food such as fruits, vegetables, low fat milk, low fat cheese, yogurt, lean meant, poultry, fish, beans, tofu, etc. For more information go to www.thenutritionsource.org  Drink alcohol in moderation- Limit alcohol intake to less than two drinks a day. Never drink and drive.  Dentist- Brush and floss twice daily; visit your dentist twice a year.  Depression- Your emotional health is as important as your physical health. If you're feeling down, or losing interest in things you would normally enjoy please talk to your healthcare provider.  Eye exam- Visit your eye doctor every year.  Safe sex- If you may be exposed to a sexually transmitted infection, use a condom.  Seat belts- Seat belts can save your life; always wear one.  Smoke/Carbon Monoxide detectors- These detectors need to be installed on the appropriate level of your home.  Replace batteries at least once a year.  Skin cancer- When out in the sun, cover up and use sunscreen 15 SPF or higher.  Violence- If anyone is threatening you, please tell your healthcare provider.  Living Will/ Health care power of attorney- Speak with your healthcare provider and family.   If you have lab work done today you will be contacted with your lab results within the next 2 weeks.  If you have not heard from [place] then please contact us. The fastest way to get your results is to register for My Chart.   IF you received an x-ray today, you will receive an invoice from [facility]. Please contact [facility] at [phone] with questions or concerns regarding your invoice.   IF you received labwork today, you will receive an  invoice from [facility]. Please contact [facility] at [phone] with questions or concerns regarding your invoice.   Our billing staff will not be able to assist you with questions regarding bills from these companies.  You will be contacted with the lab results as soon as they are available. The fastest way to get your results is to activate your My Chart account. Instructions are located on the last page of this paperwork. If you have not heard from [place] regarding the results in 2 weeks, please contact this office.

## 2019-03-09 ENCOUNTER — Other Ambulatory Visit: Payer: Self-pay

## 2019-03-09 ENCOUNTER — Emergency Department (HOSPITAL_BASED_OUTPATIENT_CLINIC_OR_DEPARTMENT_OTHER): Payer: 59

## 2019-03-09 ENCOUNTER — Emergency Department (HOSPITAL_BASED_OUTPATIENT_CLINIC_OR_DEPARTMENT_OTHER)
Admission: EM | Admit: 2019-03-09 | Discharge: 2019-03-10 | Disposition: A | Discharge status: Home / Self Care | Payer: 59 | Attending: Emergency Medicine | Admitting: Emergency Medicine

## 2019-03-09 ENCOUNTER — Encounter (HOSPITAL_BASED_OUTPATIENT_CLINIC_OR_DEPARTMENT_OTHER): Payer: Self-pay

## 2019-03-09 DIAGNOSIS — N132 Hydronephrosis with renal and ureteral calculous obstruction: Secondary | ICD-10-CM | POA: Insufficient documentation

## 2019-03-09 DIAGNOSIS — E782 Mixed hyperlipidemia: Secondary | ICD-10-CM | POA: Diagnosis not present

## 2019-03-09 DIAGNOSIS — I10 Essential (primary) hypertension: Secondary | ICD-10-CM | POA: Insufficient documentation

## 2019-03-09 DIAGNOSIS — N200 Calculus of kidney: Secondary | ICD-10-CM

## 2019-03-09 DIAGNOSIS — Z79899 Other long term (current) drug therapy: Secondary | ICD-10-CM | POA: Diagnosis not present

## 2019-03-09 DIAGNOSIS — Z7982 Long term (current) use of aspirin: Secondary | ICD-10-CM | POA: Insufficient documentation

## 2019-03-09 DIAGNOSIS — R1031 Right lower quadrant pain: Secondary | ICD-10-CM | POA: Diagnosis present

## 2019-03-09 LAB — CBC WITH DIFFERENTIAL/PLATELET
Abs Immature Granulocytes: 0.04 10*3/uL (ref 0.00–0.07)
Basophils Absolute: 0 10*3/uL (ref 0.0–0.1)
Basophils Relative: 0 %
Eosinophils Absolute: 0.1 10*3/uL (ref 0.0–0.5)
Eosinophils Relative: 1 %
HCT: 42.4 % (ref 39.0–52.0)
Hemoglobin: 13.9 g/dL (ref 13.0–17.0)
Immature Granulocytes: 0 %
Lymphocytes Relative: 20 %
Lymphs Abs: 2.3 10*3/uL (ref 0.7–4.0)
MCH: 29.1 pg (ref 26.0–34.0)
MCHC: 32.8 g/dL (ref 30.0–36.0)
MCV: 88.7 fL (ref 80.0–100.0)
Monocytes Absolute: 0.8 10*3/uL (ref 0.1–1.0)
Monocytes Relative: 7 %
Neutro Abs: 8 10*3/uL — ABNORMAL HIGH (ref 1.7–7.7)
Neutrophils Relative %: 72 %
Platelets: 310 10*3/uL (ref 150–400)
RBC: 4.78 MIL/uL (ref 4.22–5.81)
RDW: 12.8 % (ref 11.5–15.5)
WBC: 11.2 10*3/uL — ABNORMAL HIGH (ref 4.0–10.5)
nRBC: 0 % (ref 0.0–0.2)

## 2019-03-09 LAB — COMPREHENSIVE METABOLIC PANEL
ALT: 26 IU/L (ref 0–44)
ALT: 28 U/L (ref 0–44)
AST: 19 IU/L (ref 0–40)
AST: 21 U/L (ref 15–41)
Albumin/Globulin Ratio: 2.1 (ref 1.2–2.2)
Albumin: 4.6 g/dL (ref 3.8–4.8)
Albumin: 4.3 g/dL (ref 3.5–5.0)
Alkaline Phosphatase: 83 IU/L (ref 39–117)
Alkaline Phosphatase: 61 U/L (ref 38–126)
Anion gap: 9 (ref 5–15)
BUN/Creatinine Ratio: 16 (ref 10–24)
BUN: 16 mg/dL (ref 8–27)
BUN: 21 mg/dL (ref 8–23)
Bilirubin Total: 0.6 mg/dL (ref 0.0–1.2)
CO2: 22 mmol/L (ref 20–29)
CO2: 23 mmol/L (ref 22–32)
Calcium: 9.5 mg/dL (ref 8.6–10.2)
Calcium: 9.3 mg/dL (ref 8.9–10.3)
Chloride: 105 mmol/L (ref 96–106)
Chloride: 108 mmol/L (ref 98–111)
Creatinine, Ser: 1.02 mg/dL (ref 0.76–1.27)
Creatinine, Ser: 1.33 mg/dL — ABNORMAL HIGH (ref 0.61–1.24)
GFR calc Af Amer: 89 mL/min/{1.73_m2} (ref >59)
GFR calc Af Amer: >60 mL/min (ref >60)
GFR calc non Af Amer: 77 mL/min/{1.73_m2} (ref >59)
GFR calc non Af Amer: 56 mL/min — ABNORMAL LOW (ref >60)
Globulin, Total: 2.2 g/dL (ref 1.5–4.5)
Glucose, Bld: 175 mg/dL — ABNORMAL HIGH (ref 70–99)
Glucose: 94 mg/dL (ref 65–99)
Potassium: 4.3 mmol/L (ref 3.5–5.2)
Potassium: 4.2 mmol/L (ref 3.5–5.1)
Sodium: 140 mmol/L (ref 134–144)
Sodium: 140 mmol/L (ref 135–145)
Total Bilirubin: 0.5 mg/dL (ref 0.3–1.2)
Total Protein: 6.8 g/dL (ref 6.0–8.5)
Total Protein: 7 g/dL (ref 6.5–8.1)

## 2019-03-09 LAB — URINALYSIS, ROUTINE W REFLEX MICROSCOPIC
Bilirubin Urine: NEGATIVE
Glucose, UA: NEGATIVE mg/dL
Ketones, ur: NEGATIVE mg/dL
Leukocytes,Ua: NEGATIVE
Nitrite: NEGATIVE
Protein, ur: NEGATIVE mg/dL
Specific Gravity, Urine: >1.03 — ABNORMAL HIGH (ref 1.005–1.030)
pH: 5.5 (ref 5.0–8.0)

## 2019-03-09 LAB — LIPID PANEL
Chol/HDL Ratio: 2.4 ratio (ref 0.0–5.0)
Cholesterol, Total: 133 mg/dL (ref 100–199)
HDL: 55 mg/dL (ref >39)
LDL Chol Calc (NIH): 65 mg/dL (ref 0–99)
Triglycerides: 65 mg/dL (ref 0–149)
VLDL Cholesterol Cal: 13 mg/dL (ref 5–40)

## 2019-03-09 LAB — HEMOGLOBIN A1C
Est. average glucose Bld gHb Est-mCnc: 108 mg/dL
Hgb A1c MFr Bld: 5.4 % (ref 4.8–5.6)

## 2019-03-09 LAB — PSA: Prostate Specific Ag, Serum: 1.1 ng/mL (ref 0.0–4.0)

## 2019-03-09 LAB — URINALYSIS, MICROSCOPIC (REFLEX): WBC, UA: NONE SEEN WBC/hpf (ref 0–5)

## 2019-03-09 MED ORDER — MORPHINE SULFATE (PF) 4 MG/ML IV SOLN
4.0000 mg | Freq: Once | INTRAVENOUS | Status: AC
  Administered 2019-03-09: 4 mg via INTRAVENOUS
  Filled 2019-03-09: qty 1

## 2019-03-09 MED ORDER — SODIUM CHLORIDE 0.9 % IV BOLUS (SEPSIS)
1000.0000 mL | Freq: Once | INTRAVENOUS | Status: AC
  Administered 2019-03-09: 1000 mL via INTRAVENOUS

## 2019-03-09 MED ORDER — ONDANSETRON HCL 4 MG/2ML IJ SOLN
4.0000 mg | Freq: Once | INTRAMUSCULAR | Status: AC
  Administered 2019-03-09: 23:00:00 4 mg via INTRAVENOUS
  Filled 2019-03-09: qty 2

## 2019-03-09 MED ORDER — KETOROLAC TROMETHAMINE 30 MG/ML IJ SOLN
15.0000 mg | Freq: Once | INTRAMUSCULAR | Status: AC
  Administered 2019-03-09: 23:00:00 15 mg via INTRAVENOUS
  Filled 2019-03-09: qty 1

## 2019-03-09 NOTE — ED Notes (Signed)
ED Provider at bedside. 

## 2019-03-09 NOTE — ED Notes (Signed)
Patient transported to CT 

## 2019-03-09 NOTE — ED Provider Notes (Signed)
TIME SEEN: 11:01 PM  CHIEF COMPLAINT: Right-sided flank pain  HPI: Patient is a 64 year old male with history of hypertension, hyperlipidemia, kidney stones who presents emergency department right flank pain.  Sharp and severe in nature.  No aggravating relieving factors.  Feels like previous kidney stones.  Pain started at 9pm.  Last stone in 2018.  Has never had to have surgical intervention.  Has had nausea without vomiting.  No diarrhea.  No fever.  No dysuria or hematuria.  Previous urologist was in [facility].  States has not seen them in years.  Not on any blood thinners.  ROS: See HPI Constitutional: no fever  Eyes: no drainage  ENT: no runny nose   Cardiovascular:  no chest pain  Resp: no SOB  GI: no vomiting GU: no dysuria Integumentary: no rash  Allergy: no hives  Musculoskeletal: no leg swelling  Neurological: no slurred speech ROS otherwise negative  PAST MEDICAL HISTORY/PAST SURGICAL HISTORY:  Past Medical History:  Diagnosis Date  . Allergy   . Chronic kidney disease    kidney stones  . Hypercholesteremia   . Hypertension     MEDICATIONS:  Prior to Admission medications   Medication Sig Start Date End Date Taking? Authorizing Provider  amLODipine (NORVASC) 10 MG tablet Take 1 tablet (10 mg total) by mouth daily. 03/08/19   [name], MD  aspirin EC 81 MG tablet Take 81 mg by mouth daily.    [provider]  atorvastatin (LIPITOR) 40 MG tablet Take 0.5 tablets (20 mg total) by mouth daily. 03/08/19   [name], MD  losartan (COZAAR) 100 MG tablet Take 1 tablet (100 mg total) by mouth daily. 03/08/19   [name], MD    ALLERGIES:  No Known Allergies  SOCIAL HISTORY:  Social History   Tobacco Use  . Smoking status: Never Smoker  . Smokeless tobacco: Never Used  Substance Use Topics  . Alcohol use: Yes    Alcohol/week: 0.0 standard drinks    Comment: occ    FAMILY HISTORY: Family History  Problem Relation Age of Onset   . Hypertension Mother   . Hyperlipidemia Mother   . Cancer Father   . Heart disease Father   . Hyperlipidemia Father   . Hypertension Father   . Colon cancer Neg Hx   . Esophageal cancer Neg Hx   . Rectal cancer Neg Hx   . Stomach cancer Neg Hx   . Pancreatic cancer Neg Hx   . Prostate cancer Neg Hx     EXAM: BP (!) 157/82 (BP Location: Left Arm)   Pulse 94   Temp 98.4 F (36.9 C) (Oral)   Resp 20   SpO2 100%  CONSTITUTIONAL: Alert and oriented and responds appropriately to questions.  Appears uncomfortable, pacing in the room HEAD: Normocephalic EYES: Conjunctivae clear, pupils appear equal, EOM appear intact ENT: normal nose; moist mucous membranes NECK: Supple, normal ROM CARD: RRR; S1 and S2 appreciated; no murmurs, no clicks, no rubs, no gallops RESP: Normal chest excursion without splinting or tachypnea; breath sounds clear and equal bilaterally; no wheezes, no rhonchi, no rales, no hypoxia or respiratory distress, speaking full sentences ABD/GI: Normal bowel sounds; non-distended; soft, non-tender, no rebound, no guarding, no peritoneal signs, no hepatosplenomegaly BACK:  The back appears normal, no CVA tenderness, no midline spinal tenderness or step-off or deformity, no redness or warmth, no ecchymosis or swelling EXT: Normal ROM in all joints; no deformity noted, no edema; no cyanosis  SKIN: Normal color for age and race; warm; no rash on exposed skin NEURO: Moves all extremities equally, normal gait, normal speech PSYCH: The patient's mood and manner are appropriate.   MEDICAL DECISION MAKING: Patient here with sudden onset flank pain.  Suspect kidney stone.  Labs, urine and CT scan ordered in triage.  Will treat with Toradol, morphine, Zofran and IV fluids.  States his wife drove him here to the emergency department.  States he has not seen a urologist in years.  Reports his last stone was in 2018 and he did not have to see the urologist for this as he passed it  without difficulty.  States he has never had to have surgical intervention for his stones before.  ED PROGRESS: 12:05 AM  Pt reports only very minimal relief with Toradol and morphine.  Still pacing around the room and appears uncomfortable.  Labs show mild leukocytosis which is likely reactive.  No fever here.  Urine shows 21-50 red blood cells but no sign of infection and rare bacteria.  Creatinine mildly elevated at 1.33.  He is receiving IV fluids.  CT scan shows 8 mm proximal right ureteral calculus with mild right hydronephrosis and perinephric stranding.  Discussed with patient that he may need surgical intervention given the size of the stone.  We will continue to work aggressively on pain control.  Will give dose of IV Dilaudid.  12:45 AM Pt's pain is now down to a 3/10 and he feels much better and ready for discharge home.  Again discussed with patient that he is unlikely to pass this large of a stone on his own and may need intervention.  We will send a message to the on-call urologist to schedule close follow-up.  Will discharge with Percocet, Flomax, Zofran.  Discussed return precautions with patient.  He is comfortable with this plan.   At this time, I do not feel there is any life-threatening condition present. I have reviewed, interpreted and discussed all results (EKG, imaging, lab, urine as appropriate) and exam findings with patient/family. I have reviewed nursing notes and appropriate previous records.  I feel the patient is safe to be discharged home without further emergent workup and can continue workup as an outpatient as needed. Discussed usual and customary return precautions. Patient/family verbalize understanding and are comfortable with this plan.  Outpatient follow-up has been provided as needed. All questions have been answered.    Joshua [name] was evaluated in Emergency Department on 03/09/2019 for the symptoms described in the history of present illness. He was  evaluated in the context of the global COVID-19 pandemic, which necessitated consideration that the patient might be at risk for infection with the SARS-CoV-2 virus that causes COVID-19. Institutional protocols and algorithms that pertain to the evaluation of patients at risk for COVID-19 are in a state of rapid change based on information released by regulatory bodies including the CDC and federal and state organizations. These policies and algorithms were followed during the patient's care in the ED.  Patient was seen wearing N95, face shield, gloves.    Mashonda Broski, Delice Bison, DO 03/10/19 [phone]

## 2019-03-09 NOTE — ED Triage Notes (Signed)
Pt c/o right flank pain since 9pm-states feels likes a kidney stone

## 2019-03-10 MED ORDER — ONDANSETRON 4 MG PO TBDP: 4.0000 mg | ORAL_TABLET | Freq: Four times a day (QID) | ORAL | 0 refills | Status: DC | PRN

## 2019-03-10 MED ORDER — HYDROMORPHONE HCL 1 MG/ML IJ SOLN
1.0000 mg | Freq: Once | INTRAMUSCULAR | Status: AC
  Administered 2019-03-10: 1 mg via INTRAVENOUS
  Filled 2019-03-10: qty 1

## 2019-03-10 MED ORDER — OXYCODONE-ACETAMINOPHEN 5-325 MG PO TABS: 2.0000 | ORAL_TABLET | Freq: Four times a day (QID) | ORAL | 0 refills | Status: DC | PRN

## 2019-03-10 MED ORDER — OXYCODONE-ACETAMINOPHEN 5-325 MG PO TABS
1.0000 | ORAL_TABLET | Freq: Once | ORAL | Status: AC
  Administered 2019-03-10: 1 via ORAL
  Filled 2019-03-10: qty 1

## 2019-03-10 MED ORDER — TAMSULOSIN HCL 0.4 MG PO CAPS: 0.4000 mg | ORAL_CAPSULE | Freq: Every day | ORAL | 0 refills | Status: DC

## 2019-03-10 NOTE — ED Notes (Signed)
ED Provider at bedside. 

## 2019-03-11 LAB — URINE CULTURE: Culture: NO GROWTH

## 2019-03-14 ENCOUNTER — Other Ambulatory Visit: Payer: Self-pay | Admitting: Urology

## 2019-03-15 ENCOUNTER — Encounter (HOSPITAL_BASED_OUTPATIENT_CLINIC_OR_DEPARTMENT_OTHER): Payer: Self-pay | Admitting: Urology

## 2019-03-15 ENCOUNTER — Encounter: Payer: Self-pay | Admitting: Family Medicine

## 2019-03-15 ENCOUNTER — Other Ambulatory Visit: Payer: Self-pay

## 2019-03-15 ENCOUNTER — Other Ambulatory Visit: Payer: Self-pay | Admitting: Urology

## 2019-03-15 NOTE — Progress Notes (Addendum)
Spoke w/ via phone for pre-op interview---Joshua [name] needs dos----    Ekg, I stat 8        [name] results------ COVID test ------03-17-2019 Arrive at -------630 am 03-21-2019 NPO after ------midnight Medications to take morning of surgery -----atorvastatin, amlodipine, tamsulosin, oxycodone prn Diabetic medication -----n/a Patient Special Instructions ----- Pre-Op special Istructions ----- Patient verbalized understanding of instructions that were given at this phone interview. Patient denies shortness of breath, chest pain, fever, cough a this phone interview.

## 2019-03-17 ENCOUNTER — Other Ambulatory Visit (HOSPITAL_COMMUNITY)
Admission: RE | Admit: 2019-03-17 | Discharge: 2019-03-17 | Disposition: A | Discharge status: Home / Self Care | Payer: 59 | Source: Ambulatory Visit | Attending: Urology | Admitting: Urology

## 2019-03-17 DIAGNOSIS — Z20822 Contact with and (suspected) exposure to covid-19: Secondary | ICD-10-CM | POA: Insufficient documentation

## 2019-03-17 DIAGNOSIS — Z01812 Encounter for preprocedural laboratory examination: Secondary | ICD-10-CM | POA: Diagnosis present

## 2019-03-17 LAB — SARS CORONAVIRUS 2 (TAT 6-24 HRS): SARS Coronavirus 2: NEGATIVE

## 2019-03-20 NOTE — H&P (Signed)
CC/HPI: cc: kidney stone   03/13/19: 64 year old man with flank pain present to the ER found to have a 8 mm proximal ureteral calculus with mild hydronephrosis. Patient also with bilateral non-obstructing real stones <5 mm. No signs of UTI on UA. Pain is controlled with Percocet and Zofran. Patient denies fever, chills or emesis. He has passed stones on his own before and never required surgical intervention. He is not on any blood thinners.     ALLERGIES: No Known Drug Allergies    MEDICATIONS: Amlodipine Besylate 10 mg tablet  Atorvastatin Calcium 20 mg tablet  Losartan Potassium 100 mg tablet     GU PSH: None   NON-GU PSH: Ankle Arthroscopy/surgery, Right     GU PMH: None   NON-GU PMH: Hypercholesterolemia Hypertension    FAMILY HISTORY: None   SOCIAL HISTORY: Marital Status: Married Preferred Language: English; Ethnicity: Not Hispanic Or Latino; Race: White Current Smoking Status: Patient has never smoked.   Tobacco Use Assessment Completed: Used Tobacco in last 30 days? Has never drank.  Drinks 1 caffeinated drink per day.    REVIEW OF SYSTEMS:    GU Review Male:   Patient reports get up at night to urinate. Patient denies frequent urination, hard to postpone urination, burning/ pain with urination, leakage of urine, stream starts and stops, trouble starting your stream, have to strain to urinate , erection problems, and penile pain.  Gastrointestinal (Upper):   Patient denies nausea, vomiting, and indigestion/ heartburn.  Gastrointestinal (Lower):   Patient denies diarrhea and constipation.  Constitutional:   Patient denies fever, night sweats, weight loss, and fatigue.  Skin:   Patient denies skin rash/ lesion and itching.  Eyes:   Patient denies blurred vision and double vision.  Ears/ Nose/ Throat:   Patient denies sore throat and sinus problems.  Hematologic/Lymphatic:   Patient denies swollen glands and easy bruising.  Cardiovascular:   Patient denies leg  swelling and chest pains.  Respiratory:   Patient denies cough and shortness of breath.  Endocrine:   Patient denies excessive thirst.  Musculoskeletal:   Patient denies back pain and joint pain.  Neurological:   Patient denies headaches and dizziness.  Psychologic:   Patient denies depression and anxiety.   VITAL SIGNS:      03/13/2019 11:08 AM  Weight 250 lb / 113.4 kg  Height 72 in / 182.88 cm  BP 133/81 mmHg  Pulse 106 /min  Temperature 98.4 F / 36.8 C  BMI 33.9 kg/m   MULTI-SYSTEM PHYSICAL EXAMINATION:    Constitutional: Well-nourished. No physical deformities. Normally developed. Good grooming.  Neck: Neck symmetrical, not swollen. Normal tracheal position.  Respiratory: No labored breathing, no use of accessory muscles.   Cardiovascular: Normal temperature, normal extremity pulses  Skin: No paleness, no jaundice, no cyanosis. No lesion, no ulcer, no rash.  Neurologic / Psychiatric: Oriented to time, oriented to place, oriented to person. No depression, no anxiety, no agitation.  Gastrointestinal: No mass, no tenderness, no rigidity, non obese abdomen. No CVA tenderness bilaterally.  Eyes: Normal conjunctivae. Normal eyelids.  Ears, Nose, Mouth, and Throat: Left ear no scars, no lesions, no masses. Right ear no scars, no lesions, no masses. Nose no scars, no lesions, no masses. Normal hearing. Normal lips.  Musculoskeletal: Normal gait and station of head and neck.     PAST DATA REVIEWED:  Source Of History:  Patient  Records Review:   POC Tool  Urine Test Review:   Urinalysis  Notes:  03/09/2019: BUN 21, creatinine 1.33  03/08/2019: PSA 1.1   PROCEDURES:         KUB - [id number]  A single view of the abdomen is obtained.      Patient confirmed No Neulasta OnPro Device.            Urinalysis w/Scope Dipstick Dipstick Cont'd Micro  Color: Amber Bilirubin: Neg mg/dL WBC/hpf: 0 - 5/hpf  Appearance: Clear Ketones: Trace mg/dL RBC/hpf: NS (Not Seen)   Specific Gravity: 1.025 Blood: Neg ery/uL Bacteria: NS (Not Seen)  pH: 6.0 Protein: 1+ mg/dL Cystals: NS (Not Seen)  Glucose: Neg mg/dL Urobilinogen: 0.2 mg/dL Casts: NS (Not Seen)    Nitrites: Neg Trichomonas: Not Present    Leukocyte Esterase: Neg leu/uL Mucous: Present      Epithelial Cells: NS (Not Seen)      Yeast: NS (Not Seen)      Sperm: Not Present    ASSESSMENT:      ICD-10 Details  1 GU:   Renal calculus - 0000000 Acute, Uncomplicated - Nonobstructing renal stones are less than 5 mm in size and should passed on her own however will attempt to clear out the kidney on the same side is ureteral stone.  2   Ureteral calculus - A999333 Acute, Uncomplicated - Discussed different management options for 8 mm proximal ureteral calculus which include trial passage, ESWL and ureteroscopy. Patient also has nonobstructing renal calculi. He would like to proceed with ureteroscopy in hopes of getting stone free on that side. The risks and benefits of ureteroscopy were discussed with the patient including bleeding, infection, pain, stent discomfort, need for staged procedure, inability removed stone, damage to surrounding structures including ureter and kidney, need for further treatment.   PLAN:            Medications New Meds: Oxycodone-Acetaminophen 5 mg-325 mg tablet 1 tablet PO Q 6 H   #15  0 Refill(s)  Oxycodone-Acetaminophen 5 mg-325 mg tablet 1 tablet PO Q 6 H PRN severe kidney stone pain  #15  0 Refill(s)            Orders X-Rays: KUB          Schedule         Document Letter(s):  Created for Patient: Clinical Summary         Notes:   cc: Merri Ray, MD

## 2019-03-20 NOTE — Anesthesia Preprocedure Evaluation (Addendum)
Anesthesia Evaluation  Patient identified by MRN, date of birth, ID band Patient awake    Reviewed: Allergy & Precautions, NPO status , Patient's Chart, lab work & pertinent test results  Airway Mallampati: I  TM Distance: >3 FB Neck ROM: Full    Dental no notable dental hx. (+) Teeth Intact, Dental Advisory Given   Pulmonary neg pulmonary ROS,    Pulmonary exam normal breath sounds clear to auscultation       Cardiovascular hypertension, Pt. on medications Normal cardiovascular exam Rhythm:Regular Rate:Normal  1-25 -21  EKG SR r 83 w LVH   Neuro/Psych negative neurological ROS  negative psych ROS   GI/Hepatic Neg liver ROS,   Endo/Other  negative endocrine ROS  Renal/GU K+ 4.2 Cr 1.33     Musculoskeletal negative musculoskeletal ROS (+)   Abdominal (+) + obese,   Peds  Hematology Hgb 13.9 plt 310   Anesthesia Other Findings   Reproductive/Obstetrics                            Anesthesia Physical Anesthesia Plan  ASA: III  Anesthesia Plan: General   Post-op Pain Management:    Induction: Intravenous  PONV Risk Score and Plan: 3 and Ondansetron, Dexamethasone, Treatment may vary due to age or medical condition and Midazolam  Airway Management Planned: LMA  Additional Equipment: None  Intra-op Plan:   Post-operative Plan:   Informed Consent: I have reviewed the patients History and Physical, chart, labs and discussed the procedure including the risks, benefits and alternatives for the proposed anesthesia with the patient or authorized representative who has indicated his/her understanding and acceptance.     Dental advisory given  Plan Discussed with:   Anesthesia Plan Comments:        Anesthesia Quick Evaluation

## 2019-03-21 ENCOUNTER — Ambulatory Visit (HOSPITAL_BASED_OUTPATIENT_CLINIC_OR_DEPARTMENT_OTHER): Payer: 59 | Admitting: Anesthesiology

## 2019-03-21 ENCOUNTER — Other Ambulatory Visit: Payer: Self-pay

## 2019-03-21 ENCOUNTER — Ambulatory Visit (HOSPITAL_BASED_OUTPATIENT_CLINIC_OR_DEPARTMENT_OTHER)
Admission: RE | Admit: 2019-03-21 | Discharge: 2019-03-21 | Disposition: A | Discharge status: Home / Self Care | Payer: 59 | Attending: Urology | Admitting: Urology

## 2019-03-21 ENCOUNTER — Encounter (HOSPITAL_BASED_OUTPATIENT_CLINIC_OR_DEPARTMENT_OTHER): Payer: Self-pay | Admitting: Urology

## 2019-03-21 ENCOUNTER — Encounter (HOSPITAL_BASED_OUTPATIENT_CLINIC_OR_DEPARTMENT_OTHER)
Admission: RE | Disposition: A | Discharge status: Home / Self Care | Payer: Self-pay | Source: Home / Self Care | Attending: Urology

## 2019-03-21 DIAGNOSIS — I10 Essential (primary) hypertension: Secondary | ICD-10-CM | POA: Diagnosis not present

## 2019-03-21 DIAGNOSIS — Z79899 Other long term (current) drug therapy: Secondary | ICD-10-CM | POA: Insufficient documentation

## 2019-03-21 DIAGNOSIS — N132 Hydronephrosis with renal and ureteral calculous obstruction: Secondary | ICD-10-CM | POA: Insufficient documentation

## 2019-03-21 DIAGNOSIS — E7800 Pure hypercholesterolemia, unspecified: Secondary | ICD-10-CM | POA: Insufficient documentation

## 2019-03-21 HISTORY — PX: CYSTOSCOPY WITH RETROGRADE PYELOGRAM, URETEROSCOPY AND STENT PLACEMENT: SHX5789

## 2019-03-21 HISTORY — PX: HOLMIUM LASER APPLICATION: SHX5852

## 2019-03-21 HISTORY — DX: Personal history of urinary calculi: Z87.442

## 2019-03-21 LAB — POCT I-STAT, CHEM 8
BUN: 20 mg/dL (ref 8–23)
Calcium, Ion: 1.25 mmol/L (ref 1.15–1.40)
Chloride: 103 mmol/L (ref 98–111)
Creatinine, Ser: 1.6 mg/dL — ABNORMAL HIGH (ref 0.61–1.24)
Glucose, Bld: 100 mg/dL — ABNORMAL HIGH (ref 70–99)
HCT: 39 % (ref 39.0–52.0)
Hemoglobin: 13.3 g/dL (ref 13.0–17.0)
Potassium: 4.1 mmol/L (ref 3.5–5.1)
Sodium: 139 mmol/L (ref 135–145)
TCO2: 27 mmol/L (ref 22–32)

## 2019-03-21 SURGERY — CYSTOURETEROSCOPY, WITH RETROGRADE PYELOGRAM AND STENT INSERTION
Anesthesia: General | Site: Ureter | Laterality: Right

## 2019-03-21 MED ORDER — ONDANSETRON HCL 4 MG/2ML IJ SOLN
4.0000 mg | Freq: Once | INTRAMUSCULAR | Status: DC | PRN
  Filled 2019-03-21: qty 2

## 2019-03-21 MED ORDER — DEXAMETHASONE SODIUM PHOSPHATE 10 MG/ML IJ SOLN
INTRAMUSCULAR | Status: AC
  Filled 2019-03-21: qty 1

## 2019-03-21 MED ORDER — MIDAZOLAM HCL 5 MG/5ML IJ SOLN
INTRAMUSCULAR | Status: DC | PRN
  Administered 2019-03-21: 2 mg via INTRAVENOUS

## 2019-03-21 MED ORDER — OXYCODONE-ACETAMINOPHEN 5-325 MG PO TABS
2.0000 | ORAL_TABLET | Freq: Once | ORAL | Status: AC
  Administered 2019-03-21: 2 via ORAL
  Filled 2019-03-21: qty 2

## 2019-03-21 MED ORDER — ACETAMINOPHEN 10 MG/ML IV SOLN
1000.0000 mg | Freq: Once | INTRAVENOUS | Status: DC | PRN
  Filled 2019-03-21: qty 100

## 2019-03-21 MED ORDER — ACETAMINOPHEN 10 MG/ML IV SOLN
INTRAVENOUS | Status: AC
  Filled 2019-03-21: qty 100

## 2019-03-21 MED ORDER — LIDOCAINE 2% (20 MG/ML) 5 ML SYRINGE
INTRAMUSCULAR | Status: AC
  Filled 2019-03-21: qty 5

## 2019-03-21 MED ORDER — FENTANYL CITRATE (PF) 100 MCG/2ML IJ SOLN
INTRAMUSCULAR | Status: AC
  Filled 2019-03-21: qty 2

## 2019-03-21 MED ORDER — SODIUM CHLORIDE 0.9 % IR SOLN
Status: DC | PRN
  Administered 2019-03-21: 6000 mL via INTRAVESICAL

## 2019-03-21 MED ORDER — PHENAZOPYRIDINE HCL 200 MG PO TABS
200.0000 mg | ORAL_TABLET | Freq: Once | ORAL | Status: AC
  Administered 2019-03-21: 200 mg via ORAL
  Filled 2019-03-21: qty 1

## 2019-03-21 MED ORDER — PHENAZOPYRIDINE HCL 100 MG PO TABS
ORAL_TABLET | ORAL | Status: AC
  Filled 2019-03-21: qty 2

## 2019-03-21 MED ORDER — SODIUM CHLORIDE 0.9 % IV SOLN
INTRAVENOUS | Status: DC | PRN
  Administered 2019-03-21: 8 mL

## 2019-03-21 MED ORDER — FENTANYL CITRATE (PF) 100 MCG/2ML IJ SOLN
25.0000 ug | INTRAMUSCULAR | Status: DC | PRN
  Filled 2019-03-21: qty 1

## 2019-03-21 MED ORDER — ONDANSETRON HCL 4 MG/2ML IJ SOLN
INTRAMUSCULAR | Status: AC
  Filled 2019-03-21: qty 2

## 2019-03-21 MED ORDER — FENTANYL CITRATE (PF) 100 MCG/2ML IJ SOLN
INTRAMUSCULAR | Status: DC | PRN
  Administered 2019-03-21 (×2): 25 ug via INTRAVENOUS
  Administered 2019-03-21 (×5): 50 ug via INTRAVENOUS

## 2019-03-21 MED ORDER — OXYBUTYNIN CHLORIDE 5 MG PO TABS: 5.0000 mg | ORAL_TABLET | Freq: Three times a day (TID) | ORAL | 0 refills | Status: DC | PRN

## 2019-03-21 MED ORDER — CEFAZOLIN SODIUM-DEXTROSE 2-3 GM-%(50ML) IV SOLR
INTRAVENOUS | Status: DC | PRN
  Administered 2019-03-21: 2 g via INTRAVENOUS

## 2019-03-21 MED ORDER — ACETAMINOPHEN 10 MG/ML IV SOLN
INTRAVENOUS | Status: DC | PRN
  Administered 2019-03-21: 1000 mg via INTRAVENOUS

## 2019-03-21 MED ORDER — MIDAZOLAM HCL 2 MG/2ML IJ SOLN
INTRAMUSCULAR | Status: AC
  Filled 2019-03-21: qty 2

## 2019-03-21 MED ORDER — OXYBUTYNIN CHLORIDE 5 MG PO TABS
ORAL_TABLET | ORAL | Status: AC
  Filled 2019-03-21: qty 1

## 2019-03-21 MED ORDER — OXYCODONE-ACETAMINOPHEN 5-325 MG PO TABS
ORAL_TABLET | ORAL | Status: AC
  Filled 2019-03-21: qty 2

## 2019-03-21 MED ORDER — PHENAZOPYRIDINE HCL 200 MG PO TABS: 200.0000 mg | ORAL_TABLET | Freq: Three times a day (TID) | ORAL | 0 refills | Status: AC | PRN

## 2019-03-21 MED ORDER — CEPHALEXIN 500 MG PO CAPS: 500.0000 mg | ORAL_CAPSULE | Freq: Every day | ORAL | 0 refills | Status: AC

## 2019-03-21 MED ORDER — CEFAZOLIN SODIUM-DEXTROSE 2-4 GM/100ML-% IV SOLN
INTRAVENOUS | Status: AC
  Filled 2019-03-21: qty 100

## 2019-03-21 MED ORDER — PROPOFOL 10 MG/ML IV BOLUS
INTRAVENOUS | Status: DC | PRN
  Administered 2019-03-21: 100 mg via INTRAVENOUS
  Administered 2019-03-21: 50 mg via INTRAVENOUS
  Administered 2019-03-21: 20 mg via INTRAVENOUS
  Administered 2019-03-21: 150 mg via INTRAVENOUS
  Administered 2019-03-21: 50 mg via INTRAVENOUS

## 2019-03-21 MED ORDER — LACTATED RINGERS IV SOLN
INTRAVENOUS | Status: DC
  Administered 2019-03-21: 50 mL/h via INTRAVENOUS
  Filled 2019-03-21: qty 1000

## 2019-03-21 MED ORDER — OXYBUTYNIN CHLORIDE 5 MG PO TABS
5.0000 mg | ORAL_TABLET | Freq: Once | ORAL | Status: AC
  Administered 2019-03-21: 5 mg via ORAL
  Filled 2019-03-21: qty 1

## 2019-03-21 MED ORDER — TAMSULOSIN HCL 0.4 MG PO CAPS: 0.4000 mg | ORAL_CAPSULE | Freq: Every day | ORAL | 0 refills | Status: AC

## 2019-03-21 MED ORDER — DEXAMETHASONE SODIUM PHOSPHATE 4 MG/ML IJ SOLN
INTRAMUSCULAR | Status: DC | PRN
  Administered 2019-03-21: 10 mg via INTRAVENOUS

## 2019-03-21 MED ORDER — ONDANSETRON HCL 4 MG/2ML IJ SOLN
INTRAMUSCULAR | Status: DC | PRN
  Administered 2019-03-21: 4 mg via INTRAVENOUS

## 2019-03-21 MED ORDER — PROPOFOL 10 MG/ML IV BOLUS
INTRAVENOUS | Status: AC
  Filled 2019-03-21: qty 20

## 2019-03-21 MED ORDER — LIDOCAINE HCL (CARDIAC) PF 100 MG/5ML IV SOSY
PREFILLED_SYRINGE | INTRAVENOUS | Status: DC | PRN
  Administered 2019-03-21: 100 mg via INTRAVENOUS

## 2019-03-21 SURGICAL SUPPLY — 21 items
BAG DRAIN URO-CYSTO SKYTR STRL (DRAIN) ×2 IMPLANT
BASKET ZERO TIP NITINOL 2.4FR (BASKET) ×2 IMPLANT
CATH URET 5FR 28IN OPEN ENDED (CATHETERS) ×2 IMPLANT
CLOTH BEACON ORANGE TIMEOUT ST (SAFETY) ×2 IMPLANT
DRSG TEGADERM 2-3/8X2-3/4 SM (GAUZE/BANDAGES/DRESSINGS) ×4 IMPLANT
DRSG TEGADERM 4X4.75 (GAUZE/BANDAGES/DRESSINGS) IMPLANT
EXTRACTOR STONE 1.7FRX115CM (UROLOGICAL SUPPLIES) IMPLANT
FIBER LASER FLEXIVA 200 (UROLOGICAL SUPPLIES) IMPLANT
FIBER LASER TRAC TIP (UROLOGICAL SUPPLIES) ×2 IMPLANT
GLOVE BIO SURGEON STRL SZ 6.5 (GLOVE) ×2 IMPLANT
GOWN STRL REUS W/TWL LRG LVL3 (GOWN DISPOSABLE) ×2 IMPLANT
GUIDEWIRE ANG ZIPWIRE 038X150 (WIRE) IMPLANT
GUIDEWIRE STR DUAL SENSOR (WIRE) ×4 IMPLANT
IV NS IRRIG 3000ML ARTHROMATIC (IV SOLUTION) ×4 IMPLANT
KIT TURNOVER CYSTO (KITS) ×2 IMPLANT
MANIFOLD NEPTUNE II (INSTRUMENTS) ×2 IMPLANT
PACK CYSTO (CUSTOM PROCEDURE TRAY) ×2 IMPLANT
SHEATH URETERAL 12FRX35CM (MISCELLANEOUS) ×2 IMPLANT
STENT URET 6FRX28 CONTOUR (STENTS) ×2 IMPLANT
TUBE CONNECTING 12X1/4 (SUCTIONS) ×2 IMPLANT
TUBING UROLOGY SET (TUBING) ×2 IMPLANT

## 2019-03-21 NOTE — Transfer of Care (Signed)
   Last Vitals:  Vitals Value Taken Time  BP 143/92 03/21/19 1000  Temp 37.1 C 03/21/19 1000  Pulse 82 03/21/19 1005  Resp 13 03/21/19 1005  SpO2 95 % 03/21/19 1005  Vitals shown include unvalidated device data.  Last Pain:  Vitals:   03/21/19 0658  TempSrc: Oral  PainSc: 2       Patients Stated Pain Goal: 7 (03/21/19 [id number]) Immediate Anesthesia Transfer of Care Note  Patient: Joshua [name]  Procedure(s) Performed: Procedure(s) (LRB): CYSTOSCOPY WITH RETROGRADE PYELOGRAM, URETEROSCOPY AND STENT PLACEMENT (Right) HOLMIUM LASER APPLICATION (Right)  Patient Location: PACU  Anesthesia Type: General  Level of Consciousness: awake, alert  and oriented  Airway & Oxygen Therapy: Patient Spontanous Breathing and Patient connected to nasal cannula oxygen  Post-op Assessment: Report given to PACU RN and Post -op Vital signs reviewed and stable  Post vital signs: Reviewed and stable  Complications: No apparent anesthesia complications

## 2019-03-21 NOTE — Discharge Instructions (Signed)
Post stent placement instructions   Definitions:  Ureter: The duct that transports urine from the kidney to the bladder. Stent: A plastic hollow tube that is placed into the ureter, from the kidney to the bladder to prevent the ureter from swelling shut.  General instructions:  Despite the fact that no skin incisions were used, the area around the ureter and bladder is raw and irritated. The stent is a foreign body which can further irritate the bladder wall. This irritation is manifested by increased frequency of urination, both day and night, and by an increase in the urge to urinate. In some, the urge to urinate is present almost always. Sometimes the urge is strong enough that you may not be able to stop your self from urinating. This can often be controlled with medication but does not occur in everyone. A stent can safely be left in place for 3 months or greater.  You may see some blood in your urine while the stent is in place and a few days afterward. Do not be alarmed, even if the urine is clear for a while. Get off your feet and drink lots of fluids until clearing occurs. If you start to pass clots or don't improve, call us.  Diet:  You may return to your normal diet immediately. Because of the raw surface of your bladder, alcohol, spicy foods, foods high in acid and drinks with caffeine may cause irritation or frequency and should be used in moderation. To keep your urine flowing freely and avoid constipation, drink plenty of fluids during the day (8-10 glasses). Tip: Avoid cranberry juice because it is very acidic.  Activity:  Your physical activity doesn't need to be restricted. However, if you are very active, you may see some blood in the urine. We suggest that you reduce your activity under the circumstances until the bleeding has stopped.  Bowels:  It is important to keep your bowels regular during the postoperative period. Straining with bowel movements can cause bleeding. A  bowel movement every other day is reasonable. Use a mild laxative if needed, such as milk of magnesia 2-3 tablespoons, or 2 Dulcolax tablets. Call if you continue to have problems. If you had been taking narcotics for pain, before, during or after your surgery, you may be constipated. Take a laxative if necessary.  Medication:  You should resume your pre-surgery medications unless told not to. In addition you may be given an antibiotic to prevent or treat infection. Antibiotics are not always necessary. All medication should be taken as prescribed until the bottles are finished unless you are having an unusual reaction to one of the drugs.  Problems you should report to [place]:  a. Fever greater than 101F. b. Heavy bleeding, or clots (see notes above about blood in urine). c. Inability to urinate. d. Drug reactions (hives, rash, nausea, vomiting, diarrhea). e. Severe burning or pain with urination that is not improving.  Stent removal: 1. You may remove your stent on Monday, 03/27/19 2. Take your keflex prior to removing your stent 3. Remove stent by removing bandage from penis and pulling on the string until the entire stent has come out   Post Anesthesia Home Care Instructions  Activity: Get plenty of rest for the remainder of the day. A responsible individual must stay with you for 24 hours following the procedure.  For the next 24 hours, DO NOT: -Drive a car -Paediatric nurse -Drink alcoholic beverages -Take any medication unless instructed by your physician -Make  any legal decisions or sign important papers.  Meals: Start with liquid foods such as gelatin or soup. Progress to regular foods as tolerated. Avoid greasy, spicy, heavy foods. If nausea and/or vomiting occur, drink only clear liquids until the nausea and/or vomiting subsides. Call your physician if vomiting continues.  Special Instructions/Symptoms: Your throat may feel dry or sore from the anesthesia or the breathing  tube placed in your throat during surgery. If this causes discomfort, gargle with warm salt water. The discomfort should disappear within 24 hours.

## 2019-03-21 NOTE — Anesthesia Procedure Notes (Signed)
Procedure Name: LMA Insertion Date/Time: 03/21/2019 8:32 AM Performed by: Mechele Claude, CRNA Pre-anesthesia Checklist: Patient identified, Emergency Drugs available, Suction available and Patient being monitored Patient Re-evaluated:Patient Re-evaluated prior to induction Oxygen Delivery Method: Circle system utilized Preoxygenation: Pre-oxygenation with 100% oxygen Induction Type: IV induction Ventilation: Mask ventilation without difficulty LMA: LMA inserted LMA Size: 5.0 Number of attempts: 1 Airway Equipment and Method: Bite block Placement Confirmation: positive ETCO2 and breath sounds checked- equal and bilateral Tube secured with: Tape Dental Injury: Teeth and Oropharynx as per pre-operative assessment

## 2019-03-21 NOTE — Interval H&P Note (Signed)
History and Physical Interval Note:  03/21/2019 7:26 AM  Guy Sandifer Cromartie  has presented today for surgery, with the diagnosis of RIGHT URETERAL CALCULI.  The various methods of treatment have been discussed with the patient and family. After consideration of risks, benefits and other options for treatment, the patient has consented to  Procedure(s): CYSTOSCOPY WITH RETROGRADE PYELOGRAM, URETEROSCOPY AND STENT PLACEMENT (Right) HOLMIUM LASER APPLICATION (Right) as a surgical intervention.  The patient's history has been reviewed, patient examined, no change in status, stable for surgery.  I have reviewed the patient's chart and labs.  Questions were answered to the patient's satisfaction.     Chiyo Fay D Alfonsa Vaile

## 2019-03-21 NOTE — Op Note (Signed)
PATIENT:  Joshua [name]  PRE-OPERATIVE DIAGNOSIS:  right Ureteral calculus  POST-OPERATIVE DIAGNOSIS: Same  PROCEDURE:  1. Cystoscopy, right retrograde pyelogram, right ureteroscopy with laser lithotripsy basket stone extraction and stent placement  SURGEON: Jacalyn Lefevre, MD  INDICATION: 64 year old man with an 8 mm right UPJ calculus associated with flank pain.  ANESTHESIA:  General  EBL:  Minimal  DRAINS: 6Fr x 28cm right JJ ureteral stent  SPECIMEN:  None  FINDINGS: 1.  Normal anterior urethra 2.  Bilateral obstructing lateral lobes with median lobe 3.  Severe bladder trabeculations, no bladder lesions or masses 4.  Bilateral ureteral orifices in orthotopic position 5.  8 mm right proximal ureteral calculus fragmented and stone basketed 6.  6 x 26 right ureteral stent with string 7.  Right retrograde pyelogram showed dilated ureter proximal to ureteral calculus  DESCRIPTION OF PROCEDURE: The patient was taken to the major OR and placed on the table. General anesthesia was administered and then the patient was moved to the dorsal lithotomy position. The genitalia was sterilely prepped and draped. An official timeout was performed.  Initially the 46 French cystoscope with 30 lens was passed under direct vision into the bladder. The bladder was then fully inspected. It was noted be free of any tumors, stones or inflammatory lesions. Ureteral orifices were of normal configuration and position. A 6 French open-ended ureteral catheter was then passed through the cystoscope into the ureteral orifice in order to perform a right retrograde pyelogram.  A retrograde pyelogram was performed by injecting full-strength contrast up the right ureter under direct fluoroscopic control. It revealed a filling defect in the proximal ureter consistent with the stone seen on the preoperative CT scan. The remainder of the ureter was noted to be normal as was the intrarenal collecting system. I  then passed a 0.038 inch floppy-tipped guidewire through the open ended catheter and into the area of the renal pelvis and this was left in place.  A second wire was placed in similar manner.  The inner portion of a ureteral access sheath was then passed over the guidewire to gently dilate the intramural ureter.  Next a ureteral access sheath was placed over the working wire and the inner sheath and wire removed.  Flexible ureteroscopy took place however the proximal ureter was too narrow to accommodate.  The wire was replaced through the flexible ureteroscope and the ureteroscope and access sheath were removed.  Next attempts at placing the flexible ureteroscope directly over the wire into the proximal ureter were also unsuccessful.  Decision was made to proceed with semirigid ureteroscopy.  A 6 French ureteroscope was then passed under direct into the bladder and into the right orifice and up the ureter. The stone was identified and I felt it was too large to extract and therefore elected to proceed with laser lithotripsy. The 200  holmium laser fiber was used to fragment the stone. I then used the nitinol basket to extract all of the stone fragments and reinspection of the ureter ureteroscopically revealed no further stone fragments and no injury to the ureter. I then backloaded the cystoscope over the guidewire and passed the stent over the guidewire into the area of the renal pelvis. As the guidewire was removed good curl was noted in the renal pelvis. The bladder was drained and the cystoscope was then removed. The patient tolerated the procedure well no intraoperative complications.  PLAN OF CARE: Discharge to home after PACU  PATIENT DISPOSITION:  PACU - hemodynamically stable.

## 2019-03-21 NOTE — Anesthesia Postprocedure Evaluation (Signed)
Anesthesia Post Note  Patient: Kamdin Kenon Eckley  Procedure(s) Performed: CYSTOSCOPY WITH RETROGRADE PYELOGRAM, URETEROSCOPY AND STENT PLACEMENT (Right Ureter) HOLMIUM LASER APPLICATION (Right Ureter)     Patient location during evaluation: PACU Anesthesia Type: General Level of consciousness: awake and alert Pain management: pain level controlled Vital Signs Assessment: post-procedure vital signs reviewed and stable Respiratory status: spontaneous breathing, nonlabored ventilation, respiratory function stable and patient connected to nasal cannula oxygen Cardiovascular status: blood pressure returned to baseline and stable Postop Assessment: no apparent nausea or vomiting Anesthetic complications: no    Last Vitals:  Vitals:   03/21/19 1015 03/21/19 1030  BP: (!) 150/92 (!) 150/78  Pulse: 80 81  Resp: 10 15  Temp:    SpO2: 97% 94%    Last Pain:  Vitals:   03/21/19 1000  TempSrc:   PainSc: 0-No pain                 Barnet Glasgow

## 2019-04-26 ENCOUNTER — Other Ambulatory Visit: Payer: Self-pay

## 2019-04-26 ENCOUNTER — Ambulatory Visit: Payer: 59 | Attending: Audiologist | Admitting: Audiologist

## 2019-04-26 DIAGNOSIS — H9313 Tinnitus, bilateral: Secondary | ICD-10-CM | POA: Insufficient documentation

## 2019-04-26 DIAGNOSIS — H903 Sensorineural hearing loss, bilateral: Secondary | ICD-10-CM

## 2019-04-26 NOTE — Procedures (Signed)
  [facility] [address] [place], [place] [address] [place] EVALUATION  Name: [name]     DOB: 30-Aug-1955    Referent: [name], MD MRN: [id number] Date: 04/26/2019    Diagnosis: Sensorineural hearing loss (SNHL), bilateral;Tinnitus, bilateral    HISTORY: [name], age 64 y.o. years, was seen for an audiological evaluation .   Candler notices increased difficulty hearing and that his longstanding occasional tinnitus has become constant. He reports that he has noticed both the increased difficulty hearing and increased tinnitus over the last 6-8 months. The tinnitus is described as a constant static noise, which is non-bothersome. He reports that he struggles particularly in noise and when others are wearing masks. [name] reports a history of noise exposure on the job working in [employer] years ago. He did not wear hearing protection at the time. [name] notes no ear pain, pressure, or fullness. There is no history of ear surgery or ear infections. He reports that both parents developed hearing loss later in life. [name] reports that the tinnitus does not prevent him from sleeping or cause him distress. He reports no sound sensitivity.        EVALUATION:  Otoscopic inspection reveals clear ear canals with visible tympanic membranes.   Standard audiometric techniques were used to obtain air and bone conduction thresholds. Testing was completed under insert earphones and then rechecked under headphones. Improvement in thresholds was noted under headphones.   Speech reception thresholds are 30 dBHL on the left and 25 dBHL on the right using recorded spondee word lists.   Word recognition was 88% at 65 dBHL on the left at and 88% at 70 dBHL on the right using recorded NU-6 word lists, in quiet.  Tympanometry showed normal (Type A) tympanograms bilaterally.  Acoustic reflex testing showed normal reflex thresholds  ipsilaterally and contralaterally on the left ear, however a seal could not be maintained for reflex testing on the right ear.  Acoustic reflex decay testing was within normal for the left ear and could not be tested on the right ear.  CONCLUSION:  [name] has a mild sloping to moderate high frequency sensorineural hearing loss bilaterally.  Word recognition is good in quiet at conversational speech levels bilaterally. Bilateral non-bothersome tinnitus is reported.  RECOMMENDATIONS: 1.   Repeat hearing evaluation as medically indicated or should a change in hearing or tinnitus be noted. 2.   Consider use of hearing aids. Local office contact information was provided.   3. To minimize the adverse effects of tinnitus 1) avoid quiet  2) use noise maskers at home such as a sound machine, quiet music, a fan or other background noise at a volume just loud enough to mask the high pitched tinnitus. 3) If the tinnitus becomes more bothersome, adversely affecting your sleep or concentration, contact your physician,  seek additional medical help by an ENT for further treatment of your tinnitus.  4. Use of communication strategies to assist in difficult listening environments.    Stefani Dama, Au.D., CCC-A Doctor of Audiology 04/26/2019  cc: [name], MD

## 2019-08-04 ENCOUNTER — Other Ambulatory Visit: Payer: Self-pay | Admitting: Family Medicine

## 2019-08-04 DIAGNOSIS — I10 Essential (primary) hypertension: Secondary | ICD-10-CM

## 2019-08-30 ENCOUNTER — Other Ambulatory Visit: Payer: Self-pay | Admitting: Family Medicine

## 2019-08-30 DIAGNOSIS — I10 Essential (primary) hypertension: Secondary | ICD-10-CM

## 2019-08-30 NOTE — Telephone Encounter (Signed)
Requested Prescriptions  Pending Prescriptions Disp Refills  . losartan (COZAAR) 100 MG tablet [Pharmacy Med Name: LOSARTAN POTASSIUM 100 MG TAB] 30 tablet 0    Sig: TAKE 1 TABLET BY MOUTH EVERY DAY     Cardiovascular:  Angiotensin Receptor Blockers Failed - 08/30/2019  1:31 AM      Failed - Cr in normal range and within 180 days    Creat  Date Value Ref Range Status  12/02/2015 1.10 0.70 - 1.25 mg/dL Final    Comment:      For patients > or = 65 years of age: The upper reference limit for Creatinine is approximately 13% higher for people identified as African-American.      Creatinine, Ser  Date Value Ref Range Status  03/21/2019 1.60 (H) 0.61 - 1.24 mg/dL Final         Failed - Last BP in normal range    BP Readings from Last 1 Encounters:  03/21/19 (!) 160/90         Passed - K in normal range and within 180 days    Potassium  Date Value Ref Range Status  03/21/2019 4.1 3.5 - 5.1 mmol/L Final         Passed - Patient is not pregnant      Passed - Valid encounter within last 6 months    Recent Outpatient Visits          5 months ago Essential hypertension   [facility], Ranell Patrick, MD   1 year ago Mixed hyperlipidemia   [facility], Ranell Patrick, MD   1 year ago Mixed hyperlipidemia   [facility], Ranell Patrick, MD   1 year ago Annual physical exam   [facility], MD   1 year ago Nonintractable episodic headache, unspecified headache type   [facility], MD             One month refill provided, as patient is due for 6 month f/u as per provider's plan of care when patient was seen in January 2021.

## 2019-09-22 ENCOUNTER — Other Ambulatory Visit: Payer: Self-pay | Admitting: Family Medicine

## 2019-09-22 DIAGNOSIS — I10 Essential (primary) hypertension: Secondary | ICD-10-CM

## 2019-09-22 NOTE — Telephone Encounter (Signed)
Requested medication (s) are due for refill today: no  Requested medication (s) are on the active medication list: yes  Last refill:  08/30/2019  Future visit scheduled:no  Notes to clinic:  Patient given courtesy refill No appointment scheduled   Requested Prescriptions  Pending Prescriptions Disp Refills   losartan (COZAAR) 100 MG tablet [Pharmacy Med Name: LOSARTAN POTASSIUM 100 MG TAB] 30 tablet 0    Sig: TAKE 1 TABLET BY MOUTH EVERY DAY      Cardiovascular:  Angiotensin Receptor Blockers Failed - 09/22/2019  8:30 AM      Failed - Cr in normal range and within 180 days    Creat  Date Value Ref Range Status  12/02/2015 1.10 0.70 - 1.25 mg/dL Final    Comment:      For patients > or = 65 years of age: The upper reference limit for Creatinine is approximately 13% higher for people identified as African-American.      Creatinine, Ser  Date Value Ref Range Status  03/21/2019 1.60 (H) 0.61 - 1.24 mg/dL Final          Failed - K in normal range and within 180 days    Potassium  Date Value Ref Range Status  03/21/2019 4.1 3.5 - 5.1 mmol/L Final          Failed - Last BP in normal range    BP Readings from Last 1 Encounters:  03/21/19 (!) 160/90          Failed - Valid encounter within last 6 months    Recent Outpatient Visits           6 months ago Essential hypertension   [facility], Ranell Patrick, MD   1 year ago Mixed hyperlipidemia   [facility], Ranell Patrick, MD   1 year ago Mixed hyperlipidemia   [facility], Ranell Patrick, MD   1 year ago Annual physical exam   [facility], MD   1 year ago Nonintractable episodic headache, unspecified headache type   [facility], Ranell Patrick, MD              Passed - Patient is not pregnant

## 2019-09-22 NOTE — Telephone Encounter (Signed)
Please schedule f/u appt for med refills. No further refills without office visit.

## 2019-09-26 NOTE — Telephone Encounter (Signed)
Called pt. To inform them of 30 day refill and of the requirement for any future refills. Pt. Acknowledged the need for a future appt. Before further refills. Pt. Was unable to make an appt. At the time of the call.

## 2019-10-01 ENCOUNTER — Emergency Department (HOSPITAL_BASED_OUTPATIENT_CLINIC_OR_DEPARTMENT_OTHER): Payer: Medicare HMO

## 2019-10-01 ENCOUNTER — Encounter (HOSPITAL_BASED_OUTPATIENT_CLINIC_OR_DEPARTMENT_OTHER): Payer: Self-pay | Admitting: Emergency Medicine

## 2019-10-01 ENCOUNTER — Emergency Department (HOSPITAL_BASED_OUTPATIENT_CLINIC_OR_DEPARTMENT_OTHER)
Admission: EM | Admit: 2019-10-01 | Discharge: 2019-10-01 | Disposition: A | Discharge status: Home / Self Care | Payer: Medicare HMO | Attending: Emergency Medicine | Admitting: Emergency Medicine

## 2019-10-01 DIAGNOSIS — Z79899 Other long term (current) drug therapy: Secondary | ICD-10-CM | POA: Diagnosis not present

## 2019-10-01 DIAGNOSIS — I10 Essential (primary) hypertension: Secondary | ICD-10-CM | POA: Diagnosis not present

## 2019-10-01 DIAGNOSIS — Z955 Presence of coronary angioplasty implant and graft: Secondary | ICD-10-CM | POA: Insufficient documentation

## 2019-10-01 DIAGNOSIS — R109 Unspecified abdominal pain: Secondary | ICD-10-CM | POA: Diagnosis present

## 2019-10-01 DIAGNOSIS — Z7982 Long term (current) use of aspirin: Secondary | ICD-10-CM | POA: Diagnosis not present

## 2019-10-01 DIAGNOSIS — M4319 Spondylolisthesis, multiple sites in spine: Secondary | ICD-10-CM | POA: Diagnosis not present

## 2019-10-01 DIAGNOSIS — N400 Enlarged prostate without lower urinary tract symptoms: Secondary | ICD-10-CM | POA: Diagnosis not present

## 2019-10-01 DIAGNOSIS — K449 Diaphragmatic hernia without obstruction or gangrene: Secondary | ICD-10-CM | POA: Diagnosis not present

## 2019-10-01 DIAGNOSIS — N200 Calculus of kidney: Secondary | ICD-10-CM | POA: Insufficient documentation

## 2019-10-01 LAB — URINALYSIS, ROUTINE W REFLEX MICROSCOPIC
Bilirubin Urine: NEGATIVE
Glucose, UA: NEGATIVE mg/dL
Ketones, ur: NEGATIVE mg/dL
Leukocytes,Ua: NEGATIVE
Nitrite: NEGATIVE
Protein, ur: NEGATIVE mg/dL
Specific Gravity, Urine: 1.02 (ref 1.005–1.030)
pH: 6 (ref 5.0–8.0)

## 2019-10-01 LAB — CBC WITH DIFFERENTIAL/PLATELET
Abs Immature Granulocytes: 0.02 10*3/uL (ref 0.00–0.07)
Basophils Absolute: 0 10*3/uL (ref 0.0–0.1)
Basophils Relative: 1 %
Eosinophils Absolute: 0.1 10*3/uL (ref 0.0–0.5)
Eosinophils Relative: 1 %
HCT: 41 % (ref 39.0–52.0)
Hemoglobin: 13.8 g/dL (ref 13.0–17.0)
Immature Granulocytes: 0 %
Lymphocytes Relative: 21 %
Lymphs Abs: 1.5 10*3/uL (ref 0.7–4.0)
MCH: 29.5 pg (ref 26.0–34.0)
MCHC: 33.7 g/dL (ref 30.0–36.0)
MCV: 87.6 fL (ref 80.0–100.0)
Monocytes Absolute: 0.6 10*3/uL (ref 0.1–1.0)
Monocytes Relative: 8 %
Neutro Abs: 4.8 10*3/uL (ref 1.7–7.7)
Neutrophils Relative %: 69 %
Platelets: 246 10*3/uL (ref 150–400)
RBC: 4.68 MIL/uL (ref 4.22–5.81)
RDW: 13 % (ref 11.5–15.5)
WBC: 7 10*3/uL (ref 4.0–10.5)
nRBC: 0 % (ref 0.0–0.2)

## 2019-10-01 LAB — COMPREHENSIVE METABOLIC PANEL
ALT: 26 U/L (ref 0–44)
AST: 21 U/L (ref 15–41)
Albumin: 4.1 g/dL (ref 3.5–5.0)
Alkaline Phosphatase: 63 U/L (ref 38–126)
Anion gap: 10 (ref 5–15)
BUN: 20 mg/dL (ref 8–23)
CO2: 23 mmol/L (ref 22–32)
Calcium: 9 mg/dL (ref 8.9–10.3)
Chloride: 105 mmol/L (ref 98–111)
Creatinine, Ser: 1.13 mg/dL (ref 0.61–1.24)
GFR calc Af Amer: >60 mL/min (ref >60)
GFR calc non Af Amer: >60 mL/min (ref >60)
Glucose, Bld: 106 mg/dL — ABNORMAL HIGH (ref 70–99)
Potassium: 4 mmol/L (ref 3.5–5.1)
Sodium: 138 mmol/L (ref 135–145)
Total Bilirubin: 0.6 mg/dL (ref 0.3–1.2)
Total Protein: 7 g/dL (ref 6.5–8.1)

## 2019-10-01 LAB — URINALYSIS, MICROSCOPIC (REFLEX)

## 2019-10-01 MED ORDER — KETOROLAC TROMETHAMINE 15 MG/ML IJ SOLN
15.0000 mg | Freq: Once | INTRAMUSCULAR | Status: AC
  Administered 2019-10-01: 15 mg via INTRAVENOUS
  Filled 2019-10-01: qty 1

## 2019-10-01 NOTE — ED Notes (Signed)
Pt discharged to home. Discharge instructions have been discussed with patient and/or family members. Pt verbally acknowledges understanding d/c instructions, and endorses comprehension to checkout at registration before leaving.  °

## 2019-10-01 NOTE — Discharge Instructions (Signed)
Recommend follow-up with your urologist this week.  Take Tylenol Motrin as needed for pain control.  If you develop fever, worsening pain or other new concerning symptom, return to ER for reassessment.

## 2019-10-01 NOTE — ED Provider Notes (Signed)
[facility] EMERGENCY DEPARTMENT Provider Note   CSN: [id number] Arrival date & time: 10/01/19  1436     History Chief Complaint  Patient presents with  . Flank Pain    Joshua [name] is a 64 y.o. male.  Presents to ER with concern for flank pain.  Patient reports yesterday started to have some sharp, stabbing pain, intermittent moderate pain.  Today pain is more severe.  Left-sided, radiates along left side.  Urine dark but not bloody.  No fevers or chills, no vomiting.  Reports similar symptoms to prior episode of kidney stones.  Follows with Dr. Claudia Desanctis at [facility] urology.  HPI     Past Medical History:  Diagnosis Date  . Allergy   . History of kidney stones   . Hypercholesteremia   . Hypertension     Patient Active Problem List   Diagnosis Date Noted  . Hyperlipidemia 11/30/2013  . Morbid obesity ([place]) 11/30/2013  . DOE (dyspnea on exertion) 11/30/2013  . Other and unspecified hyperlipidemia 11/02/2013  . Stress at work 11/02/2013  . Erectile dysfunction 08/16/2012  . Hypertension 08/04/2011    Past Surgical History:  Procedure Laterality Date  . ANKLE SURGERY Right    no other information given  . COLONOSCOPY    . CYSTOSCOPY WITH RETROGRADE PYELOGRAM, URETEROSCOPY AND STENT PLACEMENT Right 03/21/2019   Procedure: CYSTOSCOPY WITH RETROGRADE PYELOGRAM, URETEROSCOPY AND STENT PLACEMENT;  Surgeon: Robley Fries, MD;  Location: [facility];  Service: Urology;  Laterality: Right;  . EYE SURGERY     Lasik  . HOLMIUM LASER APPLICATION Right 1/66/0630   Procedure: HOLMIUM LASER APPLICATION;  Surgeon: Robley Fries, MD;  Location: [facility];  Service: Urology;  Laterality: Right;       Family History  Problem Relation Age of Onset  . Hypertension Mother   . Hyperlipidemia Mother   . Cancer Father   . Heart disease Father   . Hyperlipidemia Father   . Hypertension Father   . Colon cancer Neg Hx   . Esophageal  cancer Neg Hx   . Rectal cancer Neg Hx   . Stomach cancer Neg Hx   . Pancreatic cancer Neg Hx   . Prostate cancer Neg Hx     Social History   Tobacco Use  . Smoking status: Never Smoker  . Smokeless tobacco: Never Used  Vaping Use  . Vaping Use: Never used  Substance Use Topics  . Alcohol use: Not Currently    Alcohol/week: 0.0 standard drinks  . Drug use: No    Home Medications Prior to Admission medications   Medication Sig Start Date End Date Taking? Authorizing Provider  acetaminophen (TYLENOL) 500 MG tablet Take 1,000 mg by mouth every 6 (six) hours as needed.    [provider]  amLODipine (NORVASC) 10 MG tablet Take 1 tablet (10 mg total) by mouth daily. 03/08/19   [name], MD  aspirin EC 81 MG tablet Take 81 mg by mouth daily.    [provider]  atorvastatin (LIPITOR) 40 MG tablet Take 0.5 tablets (20 mg total) by mouth daily. 03/08/19   [name], MD  losartan (COZAAR) 100 MG tablet TAKE 1 TABLET BY MOUTH EVERY DAY 08/30/19   [name], MD  ondansetron (ZOFRAN ODT) 4 MG disintegrating tablet Take 1 tablet (4 mg total) by mouth every 6 (six) hours as needed for nausea or vomiting. 03/10/19   Ward, Delice Bison, DO  oxybutynin (  DITROPAN) 5 MG tablet Take 1 tablet (5 mg total) by mouth every 8 (eight) hours as needed for bladder spasms. 03/21/19   Robley Fries, MD  oxyCODONE-acetaminophen (PERCOCET/ROXICET) 5-325 MG tablet Take 2 tablets by mouth every 6 (six) hours as needed for severe pain. 03/10/19   Ward, Delice Bison, DO  phenazopyridine (PYRIDIUM) 200 MG tablet Take 1 tablet (200 mg total) by mouth 3 (three) times daily as needed for pain. 03/21/19 03/20/20  Robley Fries, MD  tamsulosin (FLOMAX) 0.4 MG CAPS capsule Take 1 capsule (0.4 mg total) by mouth daily. 03/10/19   Ward, Delice Bison, DO    Allergies    Patient has no known allergies.  Review of Systems   Review of Systems  Constitutional: Negative for chills and fever.    HENT: Negative for ear pain and sore throat.   Eyes: Negative for pain and visual disturbance.  Respiratory: Negative for cough and shortness of breath.   Cardiovascular: Negative for chest pain and palpitations.  Gastrointestinal: Negative for abdominal pain and vomiting.  Genitourinary: Positive for flank pain. Negative for dysuria and hematuria.  Musculoskeletal: Negative for arthralgias and back pain.  Skin: Negative for color change and rash.  Neurological: Negative for seizures and syncope.  All other systems reviewed and are negative.   Physical Exam Updated Vital Signs BP (!) 137/96 (BP Location: Right Arm)   Pulse 69   Temp 97.7 F (36.5 C) (Oral)   Resp 18   Ht 6' (1.829 m)   Wt 113.4 kg   SpO2 100%   BMI 33.91 kg/m   Physical Exam  ED Results / Procedures / Treatments   Labs (all labs ordered are listed, but only abnormal results are displayed) Labs Reviewed  COMPREHENSIVE METABOLIC PANEL - Abnormal; Notable for the following components:      Result Value   Glucose, Bld 106 (*)    All other components within normal limits  URINALYSIS, ROUTINE W REFLEX MICROSCOPIC - Abnormal; Notable for the following components:   Hgb urine dipstick LARGE (*)    All other components within normal limits  URINALYSIS, MICROSCOPIC (REFLEX) - Abnormal; Notable for the following components:   Bacteria, UA RARE (*)    All other components within normal limits  CBC WITH DIFFERENTIAL/PLATELET    EKG None  Radiology CT Renal Stone Study  Result Date: 10/01/2019 CLINICAL DATA:  Left flank pain. EXAM: CT ABDOMEN AND PELVIS WITHOUT CONTRAST TECHNIQUE: Multidetector CT imaging of the abdomen and pelvis was performed following the standard protocol without IV contrast. COMPARISON:  March 09, 2019 FINDINGS: Lower chest: No acute abnormality. Hepatobiliary: No focal liver abnormality is seen. No gallstones, gallbladder wall thickening, or biliary dilatation. Pancreas: Unremarkable. No  pancreatic ductal dilatation or surrounding inflammatory changes. Spleen: Normal in size without focal abnormality. Adrenals/Urinary Tract: Adrenal glands are unremarkable. Kidneys are normal in size. Multiple small parapelvic left renal cysts are seen. A 2 mm nonobstructing renal stones are seen within the right kidney. 3 mm and 4 mm nonobstructing renal stones are seen within the mid and lower left kidney. Bladder is unremarkable. Stomach/Bowel: There is a small hiatal hernia. The appendix is not clearly identified. No evidence of bowel wall thickening, distention, or inflammatory changes. Vascular/Lymphatic: No significant vascular findings are present. No enlarged abdominal or pelvic lymph nodes. Reproductive: Prostate gland is mildly enlarged. Other: No abdominal wall hernia or abnormality. No abdominopelvic ascites. Musculoskeletal: There is approximately 3 mm anterolisthesis of the L5 vertebral body on S1.  IMPRESSION: 1. Bilateral nonobstructing renal stones. 2. Small hiatal hernia. 3. Mildly enlarged prostate gland. 4. 3 mm anterolisthesis of the L5 vertebral body on S1. Electronically Signed   By: Virgina Norfolk M.D.   On: 10/01/2019 16:59    Procedures Procedures (including critical care time)  Medications Ordered in ED Medications  ketorolac (TORADOL) 15 MG/ML injection 15 mg (15 mg Intravenous Given 10/01/19 1618)    ED Course  I have reviewed the triage vital signs and the nursing notes.  Pertinent labs & imaging results that were available during my care of the patient were reviewed by me and considered in my medical decision making (see chart for details).    MDM Rules/Calculators/A&P                         64 year old male with history of nephrolithiasis presents to ER with left flank pain.  Symptomatology concerning for recurrent nephrolithiasis.  While in ER, patient actually passed a small stone in urine and had complete resolution of his symptoms.  UA negative for infection,  CT negative for any acute complications from this recently passed kidney stone.  Recommended follow-up with his primary urologist and discharged home.    After the discussed management above, the patient was determined to be safe for discharge.  The patient was in agreement with this plan and all questions regarding their care were answered.  ED return precautions were discussed and the patient will return to the ED with any significant worsening of condition.    Final Clinical Impression(s) / ED Diagnoses Final diagnoses:  Nephrolithiasis    Rx / DC Orders ED Discharge Orders    None       Lucrezia Starch, MD 10/02/19 1221

## 2019-10-01 NOTE — ED Notes (Signed)
States he began having left flank pain yesterday, radiates to scrotal area. Denies fever, nausea or vomiting, states pain increases and decreases. Has hx of Kidney Stones, has seen [facility] in the past recently for a rt sided stone. EDP in at bedside

## 2019-10-01 NOTE — ED Notes (Signed)
To restroom for urine spec, pt states he passed his stone when voiding, small round dark colored stone like substance in bottom of urinal, urine strained and stone placed in spec container and labeled. Urine spec also obtained and to the lab per EDP orders

## 2019-10-01 NOTE — ED Triage Notes (Signed)
Pt c/o left sided flank pain onset yesterday.

## 2019-10-26 DIAGNOSIS — E785 Hyperlipidemia, unspecified: Secondary | ICD-10-CM | POA: Diagnosis not present

## 2019-10-26 DIAGNOSIS — Z809 Family history of malignant neoplasm, unspecified: Secondary | ICD-10-CM | POA: Diagnosis not present

## 2019-10-26 DIAGNOSIS — I10 Essential (primary) hypertension: Secondary | ICD-10-CM | POA: Diagnosis not present

## 2019-10-26 DIAGNOSIS — Z6832 Body mass index (BMI) 32.0-32.9, adult: Secondary | ICD-10-CM | POA: Diagnosis not present

## 2019-10-26 DIAGNOSIS — E669 Obesity, unspecified: Secondary | ICD-10-CM | POA: Diagnosis not present

## 2019-10-26 DIAGNOSIS — Z85828 Personal history of other malignant neoplasm of skin: Secondary | ICD-10-CM | POA: Diagnosis not present

## 2019-11-09 DIAGNOSIS — N200 Calculus of kidney: Secondary | ICD-10-CM | POA: Diagnosis not present

## 2019-11-10 DIAGNOSIS — R69 Illness, unspecified: Secondary | ICD-10-CM | POA: Diagnosis not present

## 2019-11-12 ENCOUNTER — Other Ambulatory Visit: Payer: Self-pay | Admitting: Family Medicine

## 2019-11-12 DIAGNOSIS — I10 Essential (primary) hypertension: Secondary | ICD-10-CM

## 2019-11-12 NOTE — Telephone Encounter (Signed)
Requested medication (s) are due for refill today:no  Requested medication (s) are on the active medication list: yes  Last refill:  08/31/2019  Future visit scheduled:no  Notes to clinic: review for refill   Requested Prescriptions  Pending Prescriptions Disp Refills   amLODipine (NORVASC) 10 MG tablet [Pharmacy Med Name: AMLODIPINE BESYLATE 10 MG TAB] 90 tablet 1    Sig: TAKE 1 TABLET BY MOUTH EVERY DAY      Cardiovascular:  Calcium Channel Blockers Failed - 11/12/2019  7:28 PM      Failed - Last BP in normal range    BP Readings from Last 1 Encounters:  10/01/19 (!) 137/96          Failed - Valid encounter within last 6 months    Recent Outpatient Visits           8 months ago Essential hypertension   [facility] [place], Ranell Patrick, MD   1 year ago Mixed hyperlipidemia   [facility] [place] [facility], Ranell Patrick, MD   1 year ago Mixed hyperlipidemia   [facility] [place] [facility], Ranell Patrick, MD   1 year ago Annual physical exam   [facility] [place], MD   2 years ago Nonintractable episodic headache, unspecified headache type   [facility], Ranell Patrick, MD

## 2020-02-04 DIAGNOSIS — J111 Influenza due to unidentified influenza virus with other respiratory manifestations: Secondary | ICD-10-CM | POA: Diagnosis not present

## 2020-02-04 DIAGNOSIS — J069 Acute upper respiratory infection, unspecified: Secondary | ICD-10-CM | POA: Diagnosis not present

## 2020-02-05 ENCOUNTER — Other Ambulatory Visit: Payer: Self-pay | Admitting: Family Medicine

## 2020-02-05 DIAGNOSIS — I10 Essential (primary) hypertension: Secondary | ICD-10-CM

## 2020-02-05 NOTE — Telephone Encounter (Signed)
Requested medication (s) are due for refill today: no  Requested medication (s) are on the active medication list: yes   Last refill: 11/13/2019  Future visit scheduled: no  Notes to clinic:  overdue for follow up    Requested Prescriptions  Pending Prescriptions Disp Refills   amLODipine (NORVASC) 10 MG tablet [Pharmacy Med Name: AMLODIPINE BESYLATE 10 MG TAB] 90 tablet 0    Sig: TAKE 1 TABLET BY MOUTH EVERY DAY      Cardiovascular:  Calcium Channel Blockers Failed - 02/05/2020  1:36 AM      Failed - Last BP in normal range    BP Readings from Last 1 Encounters:  10/01/19 (!) 137/96          Failed - Valid encounter within last 6 months    Recent Outpatient Visits           11 months ago Essential hypertension   [facility], Ranell Patrick, MD   1 year ago Mixed hyperlipidemia   [facility], Ranell Patrick, MD   1 year ago Mixed hyperlipidemia   [facility], Ranell Patrick, MD   1 year ago Annual physical exam   [facility] [place], MD   2 years ago Nonintractable episodic headache, unspecified headache type   [facility], Ranell Patrick, MD

## 2020-02-09 DIAGNOSIS — R059 Cough, unspecified: Secondary | ICD-10-CM | POA: Diagnosis not present

## 2020-02-09 DIAGNOSIS — J3489 Other specified disorders of nose and nasal sinuses: Secondary | ICD-10-CM | POA: Diagnosis not present

## 2020-02-09 DIAGNOSIS — R519 Headache, unspecified: Secondary | ICD-10-CM | POA: Diagnosis not present

## 2020-02-09 DIAGNOSIS — Z20822 Contact with and (suspected) exposure to covid-19: Secondary | ICD-10-CM | POA: Diagnosis not present

## 2020-02-09 DIAGNOSIS — J029 Acute pharyngitis, unspecified: Secondary | ICD-10-CM | POA: Diagnosis not present

## 2020-02-23 ENCOUNTER — Other Ambulatory Visit: Payer: Self-pay | Admitting: Family Medicine

## 2020-02-23 DIAGNOSIS — E782 Mixed hyperlipidemia: Secondary | ICD-10-CM

## 2020-03-22 DIAGNOSIS — L814 Other melanin hyperpigmentation: Secondary | ICD-10-CM | POA: Diagnosis not present

## 2020-03-22 DIAGNOSIS — D485 Neoplasm of uncertain behavior of skin: Secondary | ICD-10-CM | POA: Diagnosis not present

## 2020-03-22 DIAGNOSIS — L820 Inflamed seborrheic keratosis: Secondary | ICD-10-CM | POA: Diagnosis not present

## 2020-03-22 DIAGNOSIS — Z872 Personal history of diseases of the skin and subcutaneous tissue: Secondary | ICD-10-CM | POA: Diagnosis not present

## 2020-03-22 DIAGNOSIS — D225 Melanocytic nevi of trunk: Secondary | ICD-10-CM | POA: Diagnosis not present

## 2020-03-22 DIAGNOSIS — L905 Scar conditions and fibrosis of skin: Secondary | ICD-10-CM | POA: Diagnosis not present

## 2020-03-22 DIAGNOSIS — L570 Actinic keratosis: Secondary | ICD-10-CM | POA: Diagnosis not present

## 2020-03-22 DIAGNOSIS — L821 Other seborrheic keratosis: Secondary | ICD-10-CM | POA: Diagnosis not present

## 2020-03-22 DIAGNOSIS — Z85828 Personal history of other malignant neoplasm of skin: Secondary | ICD-10-CM | POA: Diagnosis not present

## 2020-04-10 ENCOUNTER — Other Ambulatory Visit: Payer: Self-pay | Admitting: Family Medicine

## 2020-04-10 DIAGNOSIS — I10 Essential (primary) hypertension: Secondary | ICD-10-CM

## 2020-04-10 MED ORDER — LOSARTAN POTASSIUM 100 MG PO TABS: 100.0000 mg | ORAL_TABLET | Freq: Every day | ORAL | 0 refills | Status: DC

## 2020-04-10 NOTE — Telephone Encounter (Signed)
   Notes to clinic:  Patient has appt tomorrow and requesting fill today    Requested Prescriptions  Pending Prescriptions Disp Refills   losartan (COZAAR) 100 MG tablet 30 tablet 0    Sig: Take 1 tablet (100 mg total) by mouth daily.      Cardiovascular:  Angiotensin Receptor Blockers Failed - 04/10/2020  9:26 AM      Failed - Cr in normal range and within 180 days    Creat  Date Value Ref Range Status  12/02/2015 1.10 0.70 - 1.25 mg/dL Final    Comment:      For patients > or = 66 years of age: The upper reference limit for Creatinine is approximately 13% higher for people identified as African-American.      Creatinine, Ser  Date Value Ref Range Status  10/01/2019 1.13 0.61 - 1.24 mg/dL Final          Failed - K in normal range and within 180 days    Potassium  Date Value Ref Range Status  10/01/2019 4.0 3.5 - 5.1 mmol/L Final          Failed - Last BP in normal range    BP Readings from Last 1 Encounters:  10/01/19 (!) 137/96          Failed - Valid encounter within last 6 months    Recent Outpatient Visits           1 year ago Essential hypertension   [facility], Ranell Patrick, MD   1 year ago Mixed hyperlipidemia   [facility], Ranell Patrick, MD   1 year ago Mixed hyperlipidemia   [facility], Ranell Patrick, MD   2 years ago Annual physical exam   [facility] [place], MD   2 years ago Nonintractable episodic headache, unspecified headache type   [facility], Ranell Patrick, MD       Future Appointments             Tomorrow [name], MD [facility] [place], [place] - Patient is not pregnant

## 2020-04-10 NOTE — Telephone Encounter (Signed)
Medication: losartan (COZAAR) 100 MG tablet  Has the pt contacted their pharmacy? YES but he said dr has not responded  Preferred pharmacy: [id number] - [place], [place] - [address]  Please be advised refills may take up to 3 business days.  We ask that you follow up with your pharmacy.  Pt advised he needs appt. Will try to contact office

## 2020-04-11 ENCOUNTER — Encounter: Payer: Self-pay | Admitting: Family Medicine

## 2020-04-11 ENCOUNTER — Other Ambulatory Visit: Payer: Self-pay

## 2020-04-11 ENCOUNTER — Ambulatory Visit (INDEPENDENT_AMBULATORY_CARE_PROVIDER_SITE_OTHER): Payer: Medicare HMO | Admitting: Family Medicine

## 2020-04-11 DIAGNOSIS — E782 Mixed hyperlipidemia: Secondary | ICD-10-CM | POA: Diagnosis not present

## 2020-04-11 DIAGNOSIS — I10 Essential (primary) hypertension: Secondary | ICD-10-CM

## 2020-04-11 MED ORDER — LOSARTAN POTASSIUM 100 MG PO TABS: 100.0000 mg | ORAL_TABLET | Freq: Every day | ORAL | 2 refills | Status: DC

## 2020-04-11 MED ORDER — ATORVASTATIN CALCIUM 40 MG PO TABS: 20.0000 mg | ORAL_TABLET | Freq: Every day | ORAL | 2 refills | Status: DC

## 2020-04-11 MED ORDER — AMLODIPINE BESYLATE 10 MG PO TABS: 10.0000 mg | ORAL_TABLET | Freq: Every day | ORAL | 2 refills | Status: DC

## 2020-04-11 NOTE — Patient Instructions (Addendum)
No change in medications today.  Monitor home readings and if they are over 130 on the top number, or over 80 on the bottom number follow-up and I can possibly adjust medication.  We will check labs today but if cholesterol is elevated that may need to be checked again after fasting for 6 to 8 hours.  Follow-up in 6 months for physical but please let me know if there are questions in the meantime.  Thank you for coming in today!  If you have lab work done today you will be contacted with your lab results within the next 2 weeks.  If you have not heard from [place] then please contact us. The fastest way to get your results is to register for My Chart.   IF you received an x-ray today, you will receive an invoice from [facility]. Please contact [facility] at [phone] with questions or concerns regarding your invoice.   IF you received labwork today, you will receive an invoice from [place]. Please contact [facility] at [phone] with questions or concerns regarding your invoice.   Our billing staff will not be able to assist you with questions regarding bills from these companies.  You will be contacted with the lab results as soon as they are available. The fastest way to get your results is to activate your My Chart account. Instructions are located on the last page of this paperwork. If you have not heard from [place] regarding the results in 2 weeks, please contact this office.

## 2020-04-11 NOTE — Progress Notes (Signed)
Subjective:  Patient ID: Joshua [name], male    DOB: [birth year]  Age: 65 y.o. MRN: [id number]  CC:  Chief Complaint  Patient presents with  . Follow-up    On hypertension. Pt reports no issues with BP since last OV. Pt states his home readings are around 128/84. Pt has been off his BP medication for about a week now and the pt stats this is why his current BP is elevated.    HPI Joshua [name] presents for   Hypertension: Last discussed in January 2021. Home readings: 128/84 on meds but off meds for the past week. No new side effects on meds.  Norvasc 10mg  qd, losartan 100mg  qd.  BP Readings from Last 3 Encounters:  04/11/20 (!) 142/90  10/01/19 (!) 137/96  03/21/19 (!) 160/90   Lab Results  Component Value Date   CREATININE 1.13 10/01/2019   Hyperlipidemia: Lipitor 40 mg - 1/2 qd.  No new side effects/myalgias.  Last ate 4.5 hrs ago.  Taking ASA 81mg  qd. - risks/benefits discussed - he would like to continue. Lab Results  Component Value Date   CHOL 133 03/08/2019   HDL 55 03/08/2019   LDLCALC 65 03/08/2019   TRIG 65 03/08/2019   CHOLHDL 2.4 03/08/2019   Lab Results  Component Value Date   ALT 26 10/01/2019   AST 21 10/01/2019   ALKPHOS 63 10/01/2019   BILITOT 0.6 10/01/2019      History Patient Active Problem List   Diagnosis Date Noted  . Hyperlipidemia 11/30/2013  . Morbid obesity ([place]) 11/30/2013  . DOE (dyspnea on exertion) 11/30/2013  . Other and unspecified hyperlipidemia 11/02/2013  . Stress at work 11/02/2013  . Erectile dysfunction 08/16/2012  . Hypertension 08/04/2011   Past Medical History:  Diagnosis Date  . Allergy   . History of kidney stones   . Hypercholesteremia   . Hypertension    Past Surgical History:  Procedure Laterality Date  . ANKLE SURGERY Right    no other information given  . COLONOSCOPY    . CYSTOSCOPY WITH RETROGRADE PYELOGRAM, URETEROSCOPY AND STENT PLACEMENT Right 03/21/2019   Procedure: CYSTOSCOPY  WITH RETROGRADE PYELOGRAM, URETEROSCOPY AND STENT PLACEMENT;  Surgeon: Robley Fries, MD;  Location: [facility];  Service: Urology;  Laterality: Right;  . EYE SURGERY     Lasik  . HOLMIUM LASER APPLICATION Right 10/22/5619   Procedure: HOLMIUM LASER APPLICATION;  Surgeon: Robley Fries, MD;  Location: [facility];  Service: Urology;  Laterality: Right;   No Known Allergies Prior to Admission medications   Medication Sig Start Date End Date Taking? Authorizing Provider  acetaminophen (TYLENOL) 500 MG tablet Take 1,000 mg by mouth every 6 (six) hours as needed.   Yes [provider]  amLODipine (NORVASC) 10 MG tablet TAKE 1 TABLET BY MOUTH EVERY DAY 02/05/20  Yes [name], MD  aspirin EC 81 MG tablet Take 81 mg by mouth daily.   Yes [provider]  atorvastatin (LIPITOR) 40 MG tablet TAKE 1/2 TABLET BY MOUTH DAILY 02/23/20  Yes [name], MD  losartan (COZAAR) 100 MG tablet Take 1 tablet (100 mg total) by mouth daily. 04/10/20  Yes [name], MD  ondansetron (ZOFRAN ODT) 4 MG disintegrating tablet Take 1 tablet (4 mg total) by mouth every 6 (six) hours as needed for nausea or vomiting. Patient not taking: Reported on 04/11/2020 03/10/19   Ward, Delice Bison, DO  oxybutynin (DITROPAN) 5 MG  tablet Take 1 tablet (5 mg total) by mouth every 8 (eight) hours as needed for bladder spasms. Patient not taking: Reported on 04/11/2020 03/21/19   Robley Fries, MD  oxyCODONE-acetaminophen (PERCOCET/ROXICET) 5-325 MG tablet Take 2 tablets by mouth every 6 (six) hours as needed for severe pain. Patient not taking: Reported on 04/11/2020 03/10/19   Ward, Delice Bison, DO  tamsulosin (FLOMAX) 0.4 MG CAPS capsule Take 1 capsule (0.4 mg total) by mouth daily. Patient not taking: Reported on 04/11/2020 03/10/19   Ward, Delice Bison, DO   Social History   Socioeconomic History  . Marital status: Married    Spouse name: Not on file  . Number of  children: 0  . Years of education: Not on file  . Highest education level: Not on file  Occupational History  . Occupation: IT consultant: [employer]  Tobacco Use  . Smoking status: Never Smoker  . Smokeless tobacco: Never Used  Vaping Use  . Vaping Use: Never used  Substance and Sexual Activity  . Alcohol use: Not Currently    Alcohol/week: 0.0 standard drinks  . Drug use: No  . Sexual activity: Yes  Other Topics Concern  . Not on file  Social History Narrative   Married. Education: [employer].    Social Determinants of Health   Financial Resource Strain: Not on file  Food Insecurity: Not on file  Transportation Needs: Not on file  Physical Activity: Not on file  Stress: Not on file  Social Connections: Not on file  Intimate Partner Violence: Not on file    Review of Systems  Constitutional: Negative for fatigue and unexpected weight change.  Eyes: Negative for visual disturbance.  Respiratory: Negative for cough, chest tightness and shortness of breath.   Cardiovascular: Negative for chest pain, palpitations and leg swelling.  Gastrointestinal: Negative for abdominal pain and blood in stool.  Neurological: Negative for dizziness, light-headedness and headaches.     Objective:   Vitals:   04/11/20 1554 04/11/20 1556  BP: (!) 147/91 (!) 142/90  Pulse: 66   Temp: 98.5 F (36.9 C)   TempSrc: Temporal   SpO2: 97%   Weight: 247 lb (112 kg)   Height: 6' (1.829 m)      Physical Exam Vitals reviewed.  Constitutional:      Appearance: He is well-developed and well-nourished.  HENT:     Head: Normocephalic and atraumatic.  Eyes:     Extraocular Movements: EOM normal.     Pupils: Pupils are equal, round, and reactive to light.  Neck:     Vascular: No carotid bruit or JVD.  Cardiovascular:     Rate and Rhythm: Normal rate and regular rhythm.     Heart sounds: Normal heart sounds. No murmur heard.   Pulmonary:     Effort: Pulmonary effort is  normal.     Breath sounds: Normal breath sounds. No rales.  Musculoskeletal:        General: No edema.  Skin:    General: Skin is warm and dry.  Neurological:     Mental Status: He is alert and oriented to person, place, and time.  Psychiatric:        Mood and Affect: Mood and affect normal.     Assessment & Plan:  JAMALE SPANGLER is a 65 y.o. male . Essential hypertension - Plan: Comprehensive metabolic panel, losartan (COZAAR) 100 MG tablet, amLODipine (NORVASC) 10 MG tablet  -Slight elevation off meds.  Restart usual  regimen, check labs, 15-month follow-up for physical.  RTC precautions if elevated readings back on meds.  Mixed hyperlipidemia - Plan: Comprehensive metabolic panel, Lipid panel, atorvastatin (LIPITOR) 40 MG tablet  -Tolerating 20 mg dose of atorvastatin, check lipid panel, may need repeat with 6 to 8-hour fasting labs.  58-month follow-up for physical.  Meds ordered this encounter  Medications  . losartan (COZAAR) 100 MG tablet    Sig: Take 1 tablet (100 mg total) by mouth daily.    Dispense:  90 tablet    Refill:  2  . atorvastatin (LIPITOR) 40 MG tablet    Sig: Take 0.5 tablets (20 mg total) by mouth daily.    Dispense:  45 tablet    Refill:  2  . amLODipine (NORVASC) 10 MG tablet    Sig: Take 1 tablet (10 mg total) by mouth daily.    Dispense:  90 tablet    Refill:  2   Patient Instructions   No change in medications today.  Monitor home readings and if they are over 130 on the top number, or over 80 on the bottom number follow-up and I can possibly adjust medication.  We will check labs today but if cholesterol is elevated that may need to be checked again after fasting for 6 to 8 hours.  Follow-up in 6 months for physical but please let me know if there are questions in the meantime.  Thank you for coming in today!  If you have lab work done today you will be contacted with your lab results within the next 2 weeks.  If you have not heard from [place] then  please contact us. The fastest way to get your results is to register for My Chart.   IF you received an x-ray today, you will receive an invoice from [facility]. Please contact [facility] at [phone] with questions or concerns regarding your invoice.   IF you received labwork today, you will receive an invoice from [place]. Please contact [facility] at [phone] with questions or concerns regarding your invoice.   Our billing staff will not be able to assist you with questions regarding bills from these companies.  You will be contacted with the lab results as soon as they are available. The fastest way to get your results is to activate your My Chart account. Instructions are located on the last page of this paperwork. If you have not heard from [place] regarding the results in 2 weeks, please contact this office.         Signed, Merri Ray, MD [facility]

## 2020-04-12 LAB — COMPREHENSIVE METABOLIC PANEL
ALT: 23 IU/L (ref 0–44)
AST: 19 IU/L (ref 0–40)
Albumin/Globulin Ratio: 1.6 (ref 1.2–2.2)
Albumin: 4.3 g/dL (ref 3.8–4.8)
Alkaline Phosphatase: 81 IU/L (ref 44–121)
BUN/Creatinine Ratio: 16 (ref 10–24)
BUN: 16 mg/dL (ref 8–27)
Bilirubin Total: 0.4 mg/dL (ref 0.0–1.2)
CO2: 21 mmol/L (ref 20–29)
Calcium: 9.3 mg/dL (ref 8.6–10.2)
Chloride: 105 mmol/L (ref 96–106)
Creatinine, Ser: 0.97 mg/dL (ref 0.76–1.27)
GFR calc Af Amer: 94 mL/min/{1.73_m2} (ref >59)
GFR calc non Af Amer: 82 mL/min/{1.73_m2} (ref >59)
Globulin, Total: 2.7 g/dL (ref 1.5–4.5)
Glucose: 91 mg/dL (ref 65–99)
Potassium: 4.3 mmol/L (ref 3.5–5.2)
Sodium: 142 mmol/L (ref 134–144)
Total Protein: 7 g/dL (ref 6.0–8.5)

## 2020-04-12 LAB — LIPID PANEL
Chol/HDL Ratio: 2.6 ratio (ref 0.0–5.0)
Cholesterol, Total: 139 mg/dL (ref 100–199)
HDL: 53 mg/dL (ref >39)
LDL Chol Calc (NIH): 65 mg/dL (ref 0–99)
Triglycerides: 116 mg/dL (ref 0–149)
VLDL Cholesterol Cal: 21 mg/dL (ref 5–40)

## 2021-01-02 ENCOUNTER — Other Ambulatory Visit: Payer: Self-pay | Admitting: Family Medicine

## 2021-01-02 DIAGNOSIS — I10 Essential (primary) hypertension: Secondary | ICD-10-CM

## 2021-01-09 ENCOUNTER — Other Ambulatory Visit: Payer: Self-pay | Admitting: Family Medicine

## 2021-01-09 DIAGNOSIS — I10 Essential (primary) hypertension: Secondary | ICD-10-CM

## 2021-01-28 ENCOUNTER — Other Ambulatory Visit: Payer: Self-pay | Admitting: Family Medicine

## 2021-01-28 DIAGNOSIS — E782 Mixed hyperlipidemia: Secondary | ICD-10-CM

## 2021-03-24 DIAGNOSIS — D225 Melanocytic nevi of trunk: Secondary | ICD-10-CM | POA: Diagnosis not present

## 2021-03-24 DIAGNOSIS — L570 Actinic keratosis: Secondary | ICD-10-CM | POA: Diagnosis not present

## 2021-03-24 DIAGNOSIS — Z08 Encounter for follow-up examination after completed treatment for malignant neoplasm: Secondary | ICD-10-CM | POA: Diagnosis not present

## 2021-03-24 DIAGNOSIS — L814 Other melanin hyperpigmentation: Secondary | ICD-10-CM | POA: Diagnosis not present

## 2021-03-24 DIAGNOSIS — Z85828 Personal history of other malignant neoplasm of skin: Secondary | ICD-10-CM | POA: Diagnosis not present

## 2021-03-24 DIAGNOSIS — Z872 Personal history of diseases of the skin and subcutaneous tissue: Secondary | ICD-10-CM | POA: Diagnosis not present

## 2021-03-24 DIAGNOSIS — L821 Other seborrheic keratosis: Secondary | ICD-10-CM | POA: Diagnosis not present

## 2021-04-15 DIAGNOSIS — I10 Essential (primary) hypertension: Secondary | ICD-10-CM | POA: Diagnosis not present

## 2021-04-15 DIAGNOSIS — E782 Mixed hyperlipidemia: Secondary | ICD-10-CM | POA: Diagnosis not present

## 2021-04-15 DIAGNOSIS — Z6835 Body mass index (BMI) 35.0-35.9, adult: Secondary | ICD-10-CM | POA: Diagnosis not present

## 2021-04-15 DIAGNOSIS — R7301 Impaired fasting glucose: Secondary | ICD-10-CM | POA: Diagnosis not present

## 2021-04-15 DIAGNOSIS — M6208 Separation of muscle (nontraumatic), other site: Secondary | ICD-10-CM | POA: Diagnosis not present

## 2021-04-15 DIAGNOSIS — Z79899 Other long term (current) drug therapy: Secondary | ICD-10-CM | POA: Diagnosis not present

## 2021-04-15 DIAGNOSIS — Z0001 Encounter for general adult medical examination with abnormal findings: Secondary | ICD-10-CM | POA: Diagnosis not present

## 2021-04-21 DIAGNOSIS — R7301 Impaired fasting glucose: Secondary | ICD-10-CM | POA: Diagnosis not present

## 2021-04-21 DIAGNOSIS — Z23 Encounter for immunization: Secondary | ICD-10-CM | POA: Diagnosis not present

## 2021-04-21 DIAGNOSIS — Z6836 Body mass index (BMI) 36.0-36.9, adult: Secondary | ICD-10-CM | POA: Diagnosis not present

## 2021-04-21 DIAGNOSIS — M6208 Separation of muscle (nontraumatic), other site: Secondary | ICD-10-CM | POA: Diagnosis not present

## 2021-04-21 DIAGNOSIS — E782 Mixed hyperlipidemia: Secondary | ICD-10-CM | POA: Diagnosis not present

## 2021-04-21 DIAGNOSIS — I10 Essential (primary) hypertension: Secondary | ICD-10-CM | POA: Diagnosis not present

## 2021-04-21 DIAGNOSIS — Z0000 Encounter for general adult medical examination without abnormal findings: Secondary | ICD-10-CM | POA: Diagnosis not present

## 2021-06-05 IMAGING — CT CT RENAL STONE PROTOCOL
2 of 4 series · 17 of 46 positions shown, 19 images · non-contrast
Comparison: March 09, 2019

CLINICAL DATA: Left flank pain.

EXAM:
CT ABDOMEN AND PELVIS WITHOUT CONTRAST
TECHNIQUE: Multidetector CT imaging of the abdomen and pelvis was performed
following the standard protocol without IV contrast.

[Series 2: axial st · axial · 0.98mm/px · z∈[-503,-48]mm · 14 of 101 slices shown, 16 images]
[im 5/101  soft-tissue]
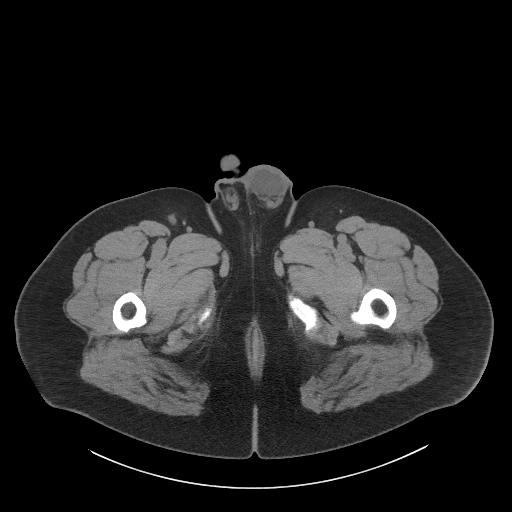
[im 5/101  bone]
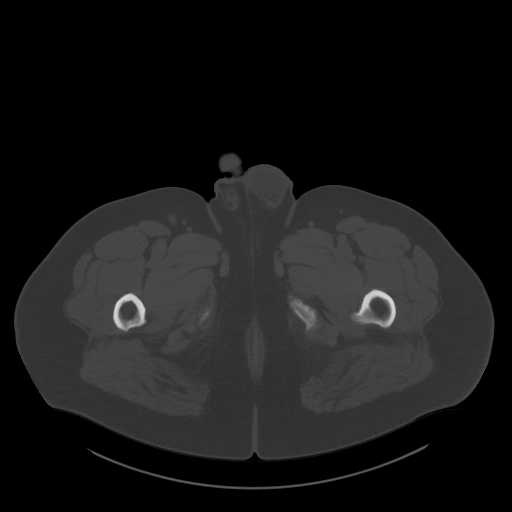
[im 13/101  soft-tissue]
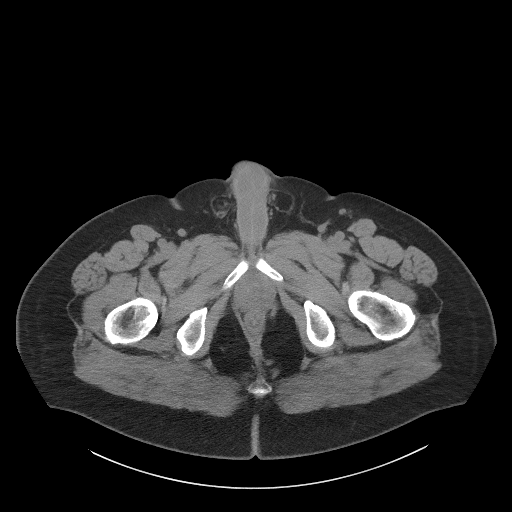
[im 21/101  soft-tissue]
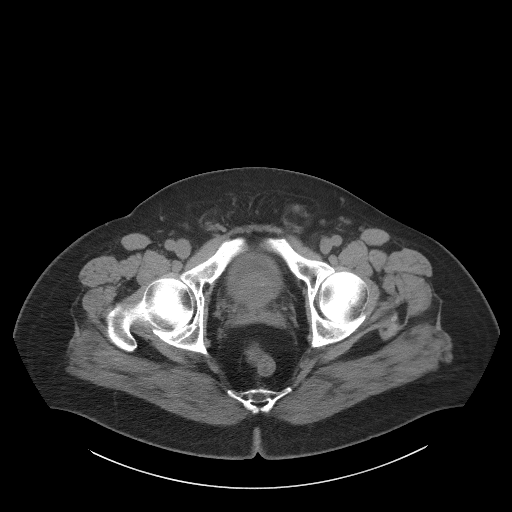
[im 26/101  soft-tissue]
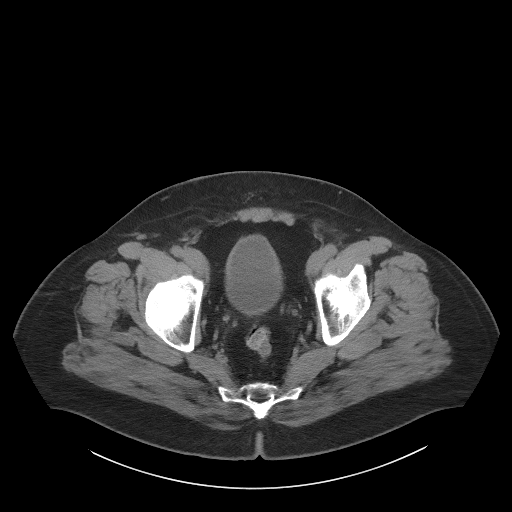
[im 34/101  soft-tissue]
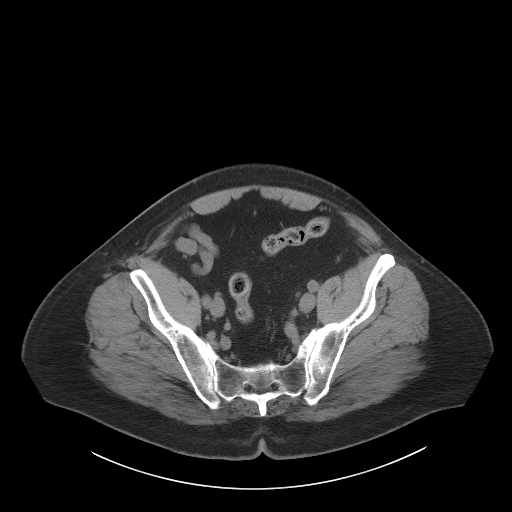
[im 42/101  soft-tissue]
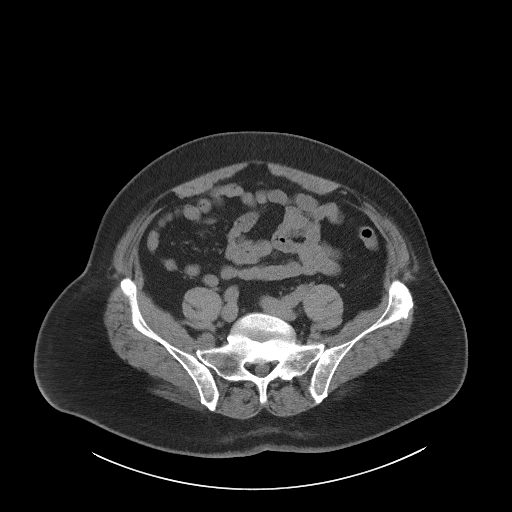
[im 46/101  soft-tissue]
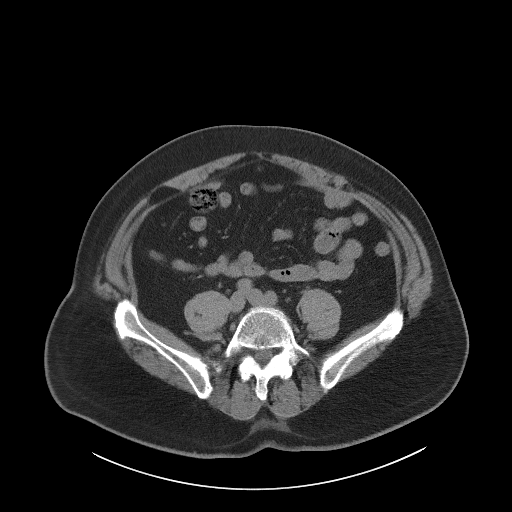
[im 55/101  soft-tissue]
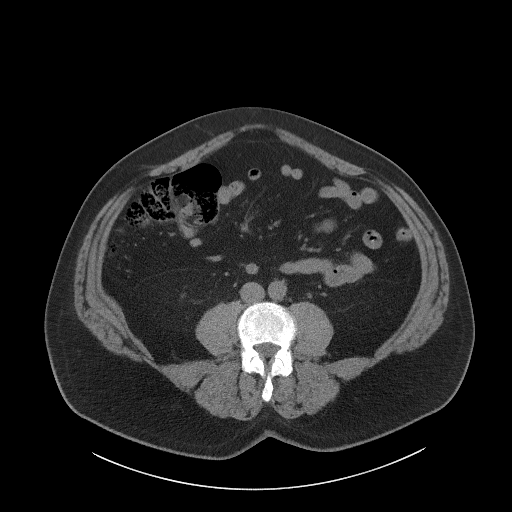
[im 59/101  soft-tissue]
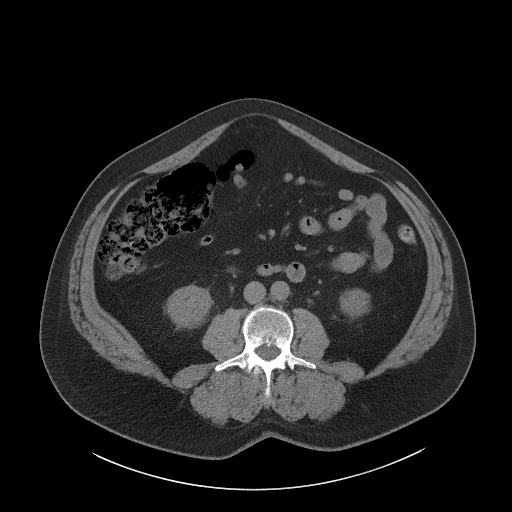
[im 59/101  bone]
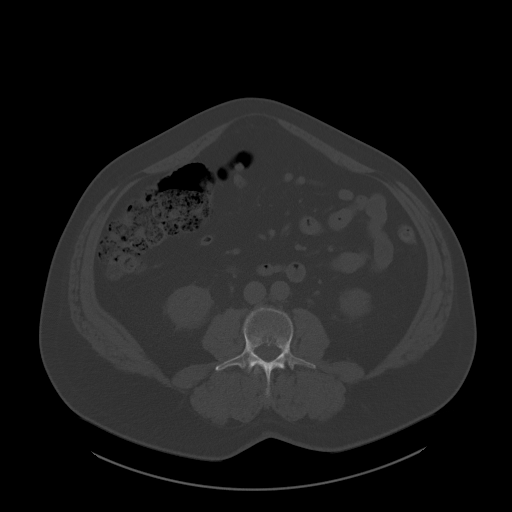
[im 67/101  soft-tissue]
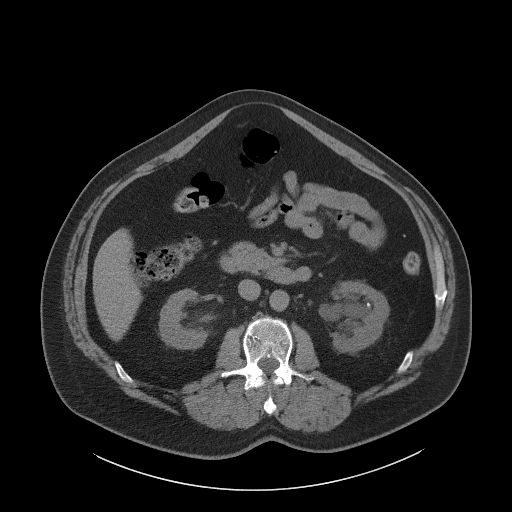
[im 76/101  soft-tissue]
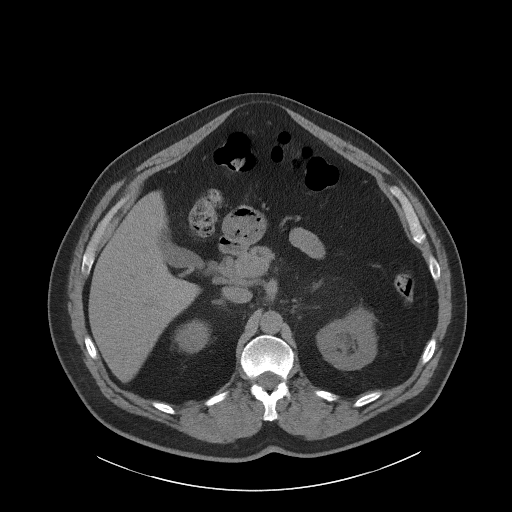
[im 80/101  soft-tissue]
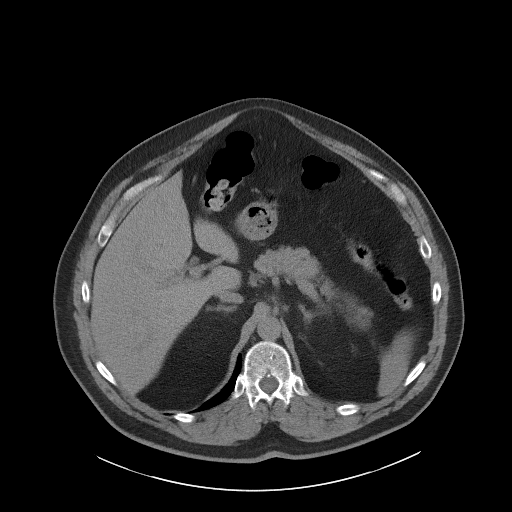
[im 88/101  soft-tissue]
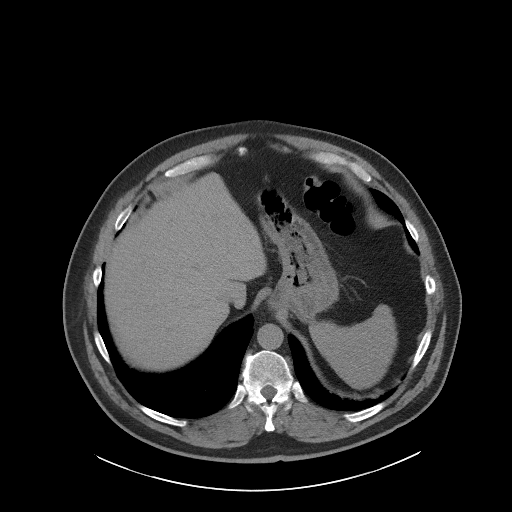
[im 96/101  soft-tissue]
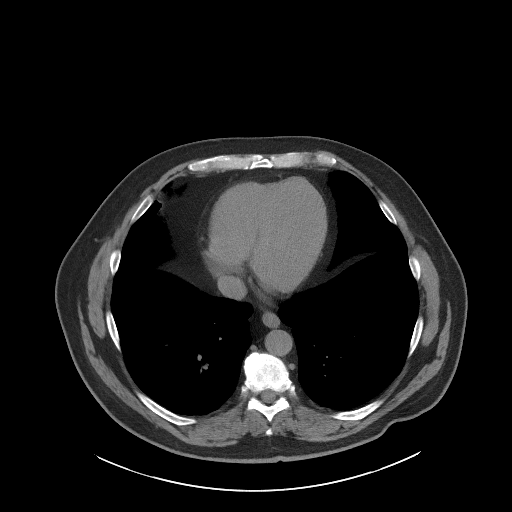

[Series 5: coronal st · coronal · 1.01mm/px · 3 of 108 slices shown]
[im 36/108  soft-tissue]
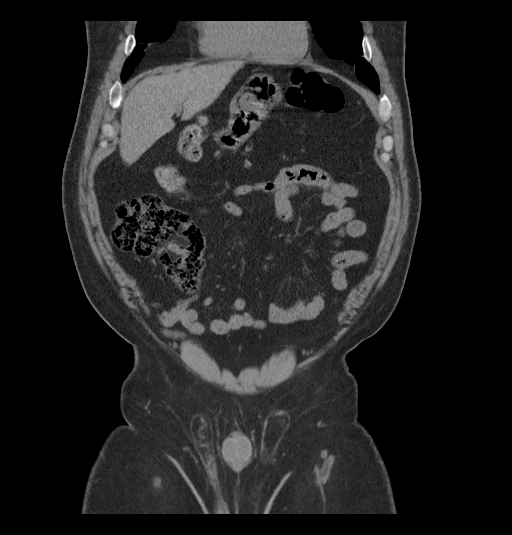
[im 48/108  soft-tissue]
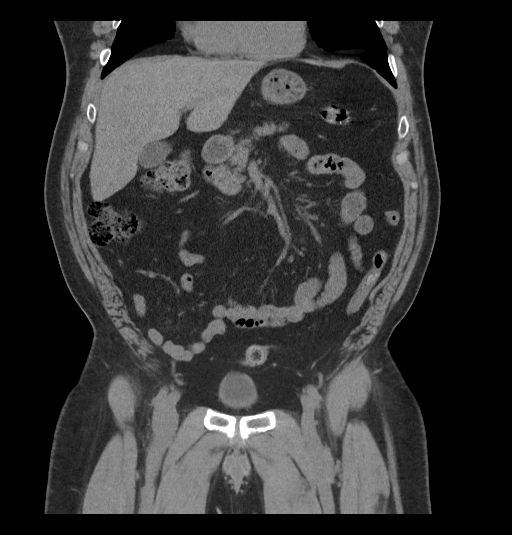
[im 60/108  soft-tissue]
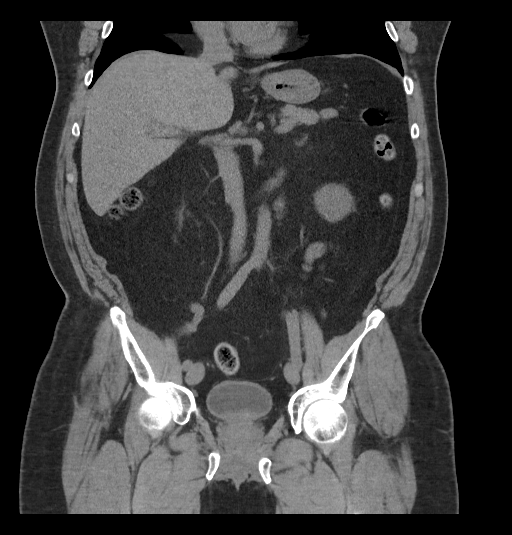

[17 of 46 positions shown; findings below may reference images not displayed]

FINDINGS: Lower chest: No acute abnormality.

Hepatobiliary: No focal liver abnormality is seen. No gallstones,
gallbladder wall thickening, or biliary dilatation.

Pancreas: Unremarkable. No pancreatic ductal dilatation or
surrounding inflammatory changes.

Spleen: Normal in size without focal abnormality.

Adrenals/Urinary Tract: Adrenal glands are unremarkable. Kidneys are
normal in size. Multiple small parapelvic left renal cysts are seen.
A 2 mm nonobstructing renal stones are seen within the right kidney.
3 mm and 4 mm nonobstructing renal stones are seen within the mid
and lower left kidney. Bladder is unremarkable.

Stomach/Bowel: There is a small hiatal hernia. The appendix is not
clearly identified. No evidence of bowel wall thickening,
distention, or inflammatory changes.

Vascular/Lymphatic: No significant vascular findings are present. No
enlarged abdominal or pelvic lymph nodes.

Reproductive: Prostate gland is mildly enlarged.

Other: No abdominal wall hernia or abnormality. No abdominopelvic
ascites.

Musculoskeletal: There is approximately 3 mm anterolisthesis of the
L5 vertebral body on S1.
IMPRESSION: 1. Bilateral nonobstructing renal stones.
2. Small hiatal hernia.
3. Mildly enlarged prostate gland.
4. 3 mm anterolisthesis of the L5 vertebral body on S1.

## 2021-08-20 DIAGNOSIS — J209 Acute bronchitis, unspecified: Secondary | ICD-10-CM | POA: Diagnosis not present

## 2021-08-20 DIAGNOSIS — M25472 Effusion, left ankle: Secondary | ICD-10-CM | POA: Diagnosis not present

## 2021-08-20 DIAGNOSIS — M25471 Effusion, right ankle: Secondary | ICD-10-CM | POA: Diagnosis not present

## 2021-08-27 DIAGNOSIS — R059 Cough, unspecified: Secondary | ICD-10-CM | POA: Diagnosis not present

## 2021-08-27 DIAGNOSIS — R051 Acute cough: Secondary | ICD-10-CM | POA: Diagnosis not present

## 2021-08-27 DIAGNOSIS — R918 Other nonspecific abnormal finding of lung field: Secondary | ICD-10-CM | POA: Diagnosis not present

## 2021-09-01 DIAGNOSIS — J189 Pneumonia, unspecified organism: Secondary | ICD-10-CM | POA: Diagnosis not present

## 2021-09-01 DIAGNOSIS — J9801 Acute bronchospasm: Secondary | ICD-10-CM | POA: Diagnosis not present

## 2021-09-02 DIAGNOSIS — J9801 Acute bronchospasm: Secondary | ICD-10-CM | POA: Diagnosis not present

## 2021-09-05 DIAGNOSIS — J189 Pneumonia, unspecified organism: Secondary | ICD-10-CM | POA: Diagnosis not present

## 2021-09-05 DIAGNOSIS — J209 Acute bronchitis, unspecified: Secondary | ICD-10-CM | POA: Diagnosis not present

## 2021-10-21 DIAGNOSIS — J069 Acute upper respiratory infection, unspecified: Secondary | ICD-10-CM | POA: Diagnosis not present

## 2022-02-13 DIAGNOSIS — R0600 Dyspnea, unspecified: Secondary | ICD-10-CM | POA: Diagnosis not present

## 2022-02-13 DIAGNOSIS — Z79899 Other long term (current) drug therapy: Secondary | ICD-10-CM | POA: Diagnosis not present

## 2022-02-13 DIAGNOSIS — I4891 Unspecified atrial fibrillation: Secondary | ICD-10-CM | POA: Diagnosis not present

## 2022-02-13 DIAGNOSIS — I10 Essential (primary) hypertension: Secondary | ICD-10-CM | POA: Diagnosis not present

## 2022-02-13 DIAGNOSIS — R0602 Shortness of breath: Secondary | ICD-10-CM | POA: Diagnosis not present

## 2022-02-14 DIAGNOSIS — I4891 Unspecified atrial fibrillation: Secondary | ICD-10-CM | POA: Diagnosis not present

## 2022-02-19 DIAGNOSIS — I480 Paroxysmal atrial fibrillation: Secondary | ICD-10-CM | POA: Diagnosis not present
